# Patient Record
Sex: Female | Born: 1935
Health system: Southern US, Community
[De-identification: ages and names within clinical notes are randomized; demographics above are authoritative.]

## PROBLEM LIST (undated history)

## (undated) ENCOUNTER — Emergency Department (HOSPITAL_COMMUNITY): Disposition: A | Discharge status: Home / Self Care | Payer: Self-pay

## (undated) HISTORY — PX: CHOLECYSTECTOMY: SHX55

---

## 2014-04-16 DIAGNOSIS — M810 Age-related osteoporosis without current pathological fracture: Secondary | ICD-10-CM | POA: Diagnosis not present

## 2014-04-16 DIAGNOSIS — I10 Essential (primary) hypertension: Secondary | ICD-10-CM | POA: Diagnosis not present

## 2014-04-16 DIAGNOSIS — D329 Benign neoplasm of meninges, unspecified: Secondary | ICD-10-CM | POA: Diagnosis not present

## 2014-06-24 DIAGNOSIS — H524 Presbyopia: Secondary | ICD-10-CM | POA: Diagnosis not present

## 2014-06-24 DIAGNOSIS — H4011X1 Primary open-angle glaucoma, mild stage: Secondary | ICD-10-CM | POA: Diagnosis not present

## 2014-07-18 DIAGNOSIS — I10 Essential (primary) hypertension: Secondary | ICD-10-CM | POA: Diagnosis not present

## 2014-09-04 DIAGNOSIS — D329 Benign neoplasm of meninges, unspecified: Secondary | ICD-10-CM | POA: Diagnosis not present

## 2014-09-10 DIAGNOSIS — M779 Enthesopathy, unspecified: Secondary | ICD-10-CM | POA: Diagnosis not present

## 2014-09-10 DIAGNOSIS — L603 Nail dystrophy: Secondary | ICD-10-CM | POA: Diagnosis not present

## 2014-10-21 DIAGNOSIS — I10 Essential (primary) hypertension: Secondary | ICD-10-CM | POA: Diagnosis not present

## 2014-10-28 DIAGNOSIS — H4011X1 Primary open-angle glaucoma, mild stage: Secondary | ICD-10-CM | POA: Diagnosis not present

## 2015-01-30 DIAGNOSIS — H401131 Primary open-angle glaucoma, bilateral, mild stage: Secondary | ICD-10-CM | POA: Diagnosis not present

## 2015-02-06 DIAGNOSIS — I872 Venous insufficiency (chronic) (peripheral): Secondary | ICD-10-CM | POA: Diagnosis not present

## 2015-02-06 DIAGNOSIS — M810 Age-related osteoporosis without current pathological fracture: Secondary | ICD-10-CM | POA: Diagnosis not present

## 2015-02-06 DIAGNOSIS — H409 Unspecified glaucoma: Secondary | ICD-10-CM | POA: Diagnosis not present

## 2015-02-06 DIAGNOSIS — M549 Dorsalgia, unspecified: Secondary | ICD-10-CM | POA: Diagnosis not present

## 2015-02-06 DIAGNOSIS — Z1389 Encounter for screening for other disorder: Secondary | ICD-10-CM | POA: Diagnosis not present

## 2015-02-06 DIAGNOSIS — I10 Essential (primary) hypertension: Secondary | ICD-10-CM | POA: Diagnosis not present

## 2015-02-06 DIAGNOSIS — Z0000 Encounter for general adult medical examination without abnormal findings: Secondary | ICD-10-CM | POA: Diagnosis not present

## 2015-02-17 DIAGNOSIS — Z1231 Encounter for screening mammogram for malignant neoplasm of breast: Secondary | ICD-10-CM | POA: Diagnosis not present

## 2015-04-02 DIAGNOSIS — M810 Age-related osteoporosis without current pathological fracture: Secondary | ICD-10-CM | POA: Diagnosis not present

## 2015-04-02 DIAGNOSIS — M8580 Other specified disorders of bone density and structure, unspecified site: Secondary | ICD-10-CM | POA: Diagnosis not present

## 2015-04-16 DIAGNOSIS — M810 Age-related osteoporosis without current pathological fracture: Secondary | ICD-10-CM | POA: Diagnosis not present

## 2015-05-02 DIAGNOSIS — H401131 Primary open-angle glaucoma, bilateral, mild stage: Secondary | ICD-10-CM | POA: Diagnosis not present

## 2015-08-01 DIAGNOSIS — H401131 Primary open-angle glaucoma, bilateral, mild stage: Secondary | ICD-10-CM | POA: Diagnosis not present

## 2015-08-19 DIAGNOSIS — D329 Benign neoplasm of meninges, unspecified: Secondary | ICD-10-CM | POA: Diagnosis not present

## 2015-08-20 DIAGNOSIS — I639 Cerebral infarction, unspecified: Secondary | ICD-10-CM | POA: Diagnosis not present

## 2015-08-20 DIAGNOSIS — D320 Benign neoplasm of cerebral meninges: Secondary | ICD-10-CM | POA: Diagnosis not present

## 2015-08-20 DIAGNOSIS — D329 Benign neoplasm of meninges, unspecified: Secondary | ICD-10-CM | POA: Diagnosis not present

## 2015-09-08 DIAGNOSIS — I083 Combined rheumatic disorders of mitral, aortic and tricuspid valves: Secondary | ICD-10-CM | POA: Diagnosis not present

## 2015-09-08 DIAGNOSIS — R29818 Other symptoms and signs involving the nervous system: Secondary | ICD-10-CM | POA: Diagnosis not present

## 2015-09-08 DIAGNOSIS — I6611 Occlusion and stenosis of right anterior cerebral artery: Secondary | ICD-10-CM | POA: Diagnosis not present

## 2015-09-08 DIAGNOSIS — G459 Transient cerebral ischemic attack, unspecified: Secondary | ICD-10-CM | POA: Diagnosis not present

## 2015-09-08 DIAGNOSIS — I358 Other nonrheumatic aortic valve disorders: Secondary | ICD-10-CM | POA: Diagnosis not present

## 2015-09-08 DIAGNOSIS — I639 Cerebral infarction, unspecified: Secondary | ICD-10-CM | POA: Diagnosis not present

## 2015-09-09 DIAGNOSIS — I639 Cerebral infarction, unspecified: Secondary | ICD-10-CM | POA: Diagnosis not present

## 2015-09-10 DIAGNOSIS — D329 Benign neoplasm of meninges, unspecified: Secondary | ICD-10-CM | POA: Diagnosis not present

## 2015-09-10 DIAGNOSIS — D320 Benign neoplasm of cerebral meninges: Secondary | ICD-10-CM | POA: Diagnosis not present

## 2015-09-10 DIAGNOSIS — I63349 Cerebral infarction due to thrombosis of unspecified cerebellar artery: Secondary | ICD-10-CM | POA: Diagnosis not present

## 2015-09-12 DIAGNOSIS — I639 Cerebral infarction, unspecified: Secondary | ICD-10-CM | POA: Diagnosis not present

## 2015-09-30 DIAGNOSIS — M6281 Muscle weakness (generalized): Secondary | ICD-10-CM | POA: Diagnosis not present

## 2015-09-30 DIAGNOSIS — R5383 Other fatigue: Secondary | ICD-10-CM | POA: Diagnosis not present

## 2015-10-24 DIAGNOSIS — I358 Other nonrheumatic aortic valve disorders: Secondary | ICD-10-CM | POA: Diagnosis not present

## 2015-10-24 DIAGNOSIS — I63349 Cerebral infarction due to thrombosis of unspecified cerebellar artery: Secondary | ICD-10-CM | POA: Diagnosis not present

## 2015-10-30 DIAGNOSIS — M6281 Muscle weakness (generalized): Secondary | ICD-10-CM | POA: Diagnosis not present

## 2015-10-30 DIAGNOSIS — R5383 Other fatigue: Secondary | ICD-10-CM | POA: Diagnosis not present

## 2015-10-30 DIAGNOSIS — I10 Essential (primary) hypertension: Secondary | ICD-10-CM | POA: Diagnosis not present

## 2015-10-30 DIAGNOSIS — R413 Other amnesia: Secondary | ICD-10-CM | POA: Diagnosis not present

## 2015-11-03 DIAGNOSIS — H401131 Primary open-angle glaucoma, bilateral, mild stage: Secondary | ICD-10-CM | POA: Diagnosis not present

## 2016-02-02 DIAGNOSIS — M79602 Pain in left arm: Secondary | ICD-10-CM | POA: Diagnosis not present

## 2016-02-02 DIAGNOSIS — Z23 Encounter for immunization: Secondary | ICD-10-CM | POA: Diagnosis not present

## 2016-02-02 DIAGNOSIS — I10 Essential (primary) hypertension: Secondary | ICD-10-CM | POA: Diagnosis not present

## 2016-02-02 DIAGNOSIS — R413 Other amnesia: Secondary | ICD-10-CM | POA: Diagnosis not present

## 2016-02-03 DIAGNOSIS — H401131 Primary open-angle glaucoma, bilateral, mild stage: Secondary | ICD-10-CM | POA: Diagnosis not present

## 2016-02-27 DIAGNOSIS — Z1231 Encounter for screening mammogram for malignant neoplasm of breast: Secondary | ICD-10-CM | POA: Diagnosis not present

## 2016-03-08 DIAGNOSIS — I10 Essential (primary) hypertension: Secondary | ICD-10-CM | POA: Diagnosis not present

## 2016-03-08 DIAGNOSIS — R413 Other amnesia: Secondary | ICD-10-CM | POA: Diagnosis not present

## 2016-03-10 DIAGNOSIS — I639 Cerebral infarction, unspecified: Secondary | ICD-10-CM | POA: Diagnosis not present

## 2016-03-10 DIAGNOSIS — I10 Essential (primary) hypertension: Secondary | ICD-10-CM | POA: Diagnosis not present

## 2016-03-10 DIAGNOSIS — I999 Unspecified disorder of circulatory system: Secondary | ICD-10-CM | POA: Diagnosis not present

## 2016-03-10 DIAGNOSIS — D329 Benign neoplasm of meninges, unspecified: Secondary | ICD-10-CM | POA: Diagnosis not present

## 2016-04-07 DIAGNOSIS — R413 Other amnesia: Secondary | ICD-10-CM | POA: Diagnosis not present

## 2016-05-04 DIAGNOSIS — H401131 Primary open-angle glaucoma, bilateral, mild stage: Secondary | ICD-10-CM | POA: Diagnosis not present

## 2016-07-06 DIAGNOSIS — R413 Other amnesia: Secondary | ICD-10-CM | POA: Diagnosis not present

## 2016-07-06 DIAGNOSIS — I10 Essential (primary) hypertension: Secondary | ICD-10-CM | POA: Diagnosis not present

## 2016-08-03 DIAGNOSIS — H401131 Primary open-angle glaucoma, bilateral, mild stage: Secondary | ICD-10-CM | POA: Diagnosis not present

## 2016-09-21 DIAGNOSIS — R51 Headache: Secondary | ICD-10-CM | POA: Diagnosis not present

## 2016-09-27 DIAGNOSIS — D329 Benign neoplasm of meninges, unspecified: Secondary | ICD-10-CM | POA: Diagnosis not present

## 2016-09-27 DIAGNOSIS — D320 Benign neoplasm of cerebral meninges: Secondary | ICD-10-CM | POA: Diagnosis not present

## 2016-09-27 DIAGNOSIS — R51 Headache: Secondary | ICD-10-CM | POA: Diagnosis not present

## 2016-10-14 DIAGNOSIS — D329 Benign neoplasm of meninges, unspecified: Secondary | ICD-10-CM | POA: Diagnosis not present

## 2016-10-14 DIAGNOSIS — R51 Headache: Secondary | ICD-10-CM | POA: Diagnosis not present

## 2016-10-14 DIAGNOSIS — R413 Other amnesia: Secondary | ICD-10-CM | POA: Diagnosis not present

## 2016-10-21 DIAGNOSIS — R51 Headache: Secondary | ICD-10-CM | POA: Diagnosis not present

## 2016-10-21 DIAGNOSIS — R413 Other amnesia: Secondary | ICD-10-CM | POA: Diagnosis not present

## 2016-11-03 DIAGNOSIS — H401131 Primary open-angle glaucoma, bilateral, mild stage: Secondary | ICD-10-CM | POA: Diagnosis not present

## 2016-11-30 DIAGNOSIS — R51 Headache: Secondary | ICD-10-CM | POA: Diagnosis not present

## 2016-11-30 DIAGNOSIS — D329 Benign neoplasm of meninges, unspecified: Secondary | ICD-10-CM | POA: Diagnosis not present

## 2016-11-30 DIAGNOSIS — R413 Other amnesia: Secondary | ICD-10-CM | POA: Diagnosis not present

## 2016-12-30 DIAGNOSIS — R413 Other amnesia: Secondary | ICD-10-CM | POA: Diagnosis not present

## 2017-01-27 DIAGNOSIS — R413 Other amnesia: Secondary | ICD-10-CM | POA: Diagnosis not present

## 2017-01-27 DIAGNOSIS — Z23 Encounter for immunization: Secondary | ICD-10-CM | POA: Diagnosis not present

## 2017-01-27 DIAGNOSIS — I10 Essential (primary) hypertension: Secondary | ICD-10-CM | POA: Diagnosis not present

## 2017-03-28 DIAGNOSIS — R413 Other amnesia: Secondary | ICD-10-CM | POA: Diagnosis not present

## 2017-03-28 DIAGNOSIS — R51 Headache: Secondary | ICD-10-CM | POA: Diagnosis not present

## 2017-03-31 DIAGNOSIS — H524 Presbyopia: Secondary | ICD-10-CM | POA: Diagnosis not present

## 2017-03-31 DIAGNOSIS — H401131 Primary open-angle glaucoma, bilateral, mild stage: Secondary | ICD-10-CM | POA: Diagnosis not present

## 2017-04-07 DIAGNOSIS — Z0000 Encounter for general adult medical examination without abnormal findings: Secondary | ICD-10-CM | POA: Diagnosis not present

## 2017-04-07 DIAGNOSIS — R413 Other amnesia: Secondary | ICD-10-CM | POA: Diagnosis not present

## 2017-04-07 DIAGNOSIS — M810 Age-related osteoporosis without current pathological fracture: Secondary | ICD-10-CM | POA: Diagnosis not present

## 2017-04-07 DIAGNOSIS — I10 Essential (primary) hypertension: Secondary | ICD-10-CM | POA: Diagnosis not present

## 2017-05-05 DIAGNOSIS — M810 Age-related osteoporosis without current pathological fracture: Secondary | ICD-10-CM | POA: Diagnosis not present

## 2017-05-05 DIAGNOSIS — M85852 Other specified disorders of bone density and structure, left thigh: Secondary | ICD-10-CM | POA: Diagnosis not present

## 2017-05-12 DIAGNOSIS — R51 Headache: Secondary | ICD-10-CM | POA: Diagnosis not present

## 2017-05-12 DIAGNOSIS — R413 Other amnesia: Secondary | ICD-10-CM | POA: Diagnosis not present

## 2017-05-12 DIAGNOSIS — G301 Alzheimer's disease with late onset: Secondary | ICD-10-CM | POA: Diagnosis not present

## 2017-07-05 DIAGNOSIS — H401131 Primary open-angle glaucoma, bilateral, mild stage: Secondary | ICD-10-CM | POA: Diagnosis not present

## 2017-07-07 DIAGNOSIS — R413 Other amnesia: Secondary | ICD-10-CM | POA: Diagnosis not present

## 2017-07-07 DIAGNOSIS — I10 Essential (primary) hypertension: Secondary | ICD-10-CM | POA: Diagnosis not present

## 2017-07-07 DIAGNOSIS — H5340 Unspecified visual field defects: Secondary | ICD-10-CM | POA: Diagnosis not present

## 2017-07-14 DIAGNOSIS — R413 Other amnesia: Secondary | ICD-10-CM | POA: Diagnosis not present

## 2017-07-14 DIAGNOSIS — D320 Benign neoplasm of cerebral meninges: Secondary | ICD-10-CM | POA: Diagnosis not present

## 2017-07-14 DIAGNOSIS — H5340 Unspecified visual field defects: Secondary | ICD-10-CM | POA: Diagnosis not present

## 2017-08-08 DIAGNOSIS — M79675 Pain in left toe(s): Secondary | ICD-10-CM | POA: Diagnosis not present

## 2017-08-08 DIAGNOSIS — B029 Zoster without complications: Secondary | ICD-10-CM | POA: Diagnosis not present

## 2017-08-08 DIAGNOSIS — B079 Viral wart, unspecified: Secondary | ICD-10-CM | POA: Diagnosis not present

## 2017-08-15 DIAGNOSIS — B029 Zoster without complications: Secondary | ICD-10-CM | POA: Diagnosis not present

## 2017-08-18 DIAGNOSIS — S90122A Contusion of left lesser toe(s) without damage to nail, initial encounter: Secondary | ICD-10-CM | POA: Diagnosis not present

## 2017-08-18 DIAGNOSIS — M7752 Other enthesopathy of left foot: Secondary | ICD-10-CM | POA: Diagnosis not present

## 2017-10-06 DIAGNOSIS — H401131 Primary open-angle glaucoma, bilateral, mild stage: Secondary | ICD-10-CM | POA: Diagnosis not present

## 2017-10-06 DIAGNOSIS — H02055 Trichiasis without entropian left lower eyelid: Secondary | ICD-10-CM | POA: Diagnosis not present

## 2017-10-11 DIAGNOSIS — E785 Hyperlipidemia, unspecified: Secondary | ICD-10-CM | POA: Diagnosis not present

## 2017-10-11 DIAGNOSIS — I10 Essential (primary) hypertension: Secondary | ICD-10-CM | POA: Diagnosis not present

## 2017-10-27 DIAGNOSIS — H401131 Primary open-angle glaucoma, bilateral, mild stage: Secondary | ICD-10-CM | POA: Diagnosis not present

## 2017-11-01 DIAGNOSIS — M545 Low back pain: Secondary | ICD-10-CM | POA: Diagnosis not present

## 2017-12-01 DIAGNOSIS — G301 Alzheimer's disease with late onset: Secondary | ICD-10-CM | POA: Diagnosis not present

## 2017-12-01 DIAGNOSIS — D329 Benign neoplasm of meninges, unspecified: Secondary | ICD-10-CM | POA: Diagnosis not present

## 2017-12-01 DIAGNOSIS — R51 Headache: Secondary | ICD-10-CM | POA: Diagnosis not present

## 2018-01-19 DIAGNOSIS — I10 Essential (primary) hypertension: Secondary | ICD-10-CM | POA: Diagnosis not present

## 2018-01-19 DIAGNOSIS — Z23 Encounter for immunization: Secondary | ICD-10-CM | POA: Diagnosis not present

## 2018-01-19 DIAGNOSIS — Z1389 Encounter for screening for other disorder: Secondary | ICD-10-CM | POA: Diagnosis not present

## 2018-03-20 DIAGNOSIS — M47812 Spondylosis without myelopathy or radiculopathy, cervical region: Secondary | ICD-10-CM | POA: Diagnosis not present

## 2018-03-20 DIAGNOSIS — M50322 Other cervical disc degeneration at C5-C6 level: Secondary | ICD-10-CM | POA: Diagnosis not present

## 2018-03-20 DIAGNOSIS — M546 Pain in thoracic spine: Secondary | ICD-10-CM | POA: Diagnosis not present

## 2018-03-20 DIAGNOSIS — M549 Dorsalgia, unspecified: Secondary | ICD-10-CM | POA: Diagnosis not present

## 2018-03-20 DIAGNOSIS — M4802 Spinal stenosis, cervical region: Secondary | ICD-10-CM | POA: Diagnosis not present

## 2018-03-29 DIAGNOSIS — H401131 Primary open-angle glaucoma, bilateral, mild stage: Secondary | ICD-10-CM | POA: Diagnosis not present

## 2018-04-27 DIAGNOSIS — M810 Age-related osteoporosis without current pathological fracture: Secondary | ICD-10-CM | POA: Diagnosis not present

## 2018-04-27 DIAGNOSIS — R413 Other amnesia: Secondary | ICD-10-CM | POA: Diagnosis not present

## 2018-04-27 DIAGNOSIS — M549 Dorsalgia, unspecified: Secondary | ICD-10-CM | POA: Diagnosis not present

## 2018-04-27 DIAGNOSIS — Z1389 Encounter for screening for other disorder: Secondary | ICD-10-CM | POA: Diagnosis not present

## 2018-04-27 DIAGNOSIS — I10 Essential (primary) hypertension: Secondary | ICD-10-CM | POA: Diagnosis not present

## 2018-04-27 DIAGNOSIS — Z0000 Encounter for general adult medical examination without abnormal findings: Secondary | ICD-10-CM | POA: Diagnosis not present

## 2018-05-31 DIAGNOSIS — G301 Alzheimer's disease with late onset: Secondary | ICD-10-CM | POA: Diagnosis not present

## 2018-11-03 DIAGNOSIS — H1032 Unspecified acute conjunctivitis, left eye: Secondary | ICD-10-CM | POA: Diagnosis not present

## 2018-11-29 DIAGNOSIS — R51 Headache: Secondary | ICD-10-CM | POA: Diagnosis not present

## 2018-11-29 DIAGNOSIS — G301 Alzheimer's disease with late onset: Secondary | ICD-10-CM | POA: Diagnosis not present

## 2019-01-12 DIAGNOSIS — I10 Essential (primary) hypertension: Secondary | ICD-10-CM | POA: Diagnosis not present

## 2019-01-12 DIAGNOSIS — Z23 Encounter for immunization: Secondary | ICD-10-CM | POA: Diagnosis not present

## 2019-04-24 DIAGNOSIS — H40033 Anatomical narrow angle, bilateral: Secondary | ICD-10-CM | POA: Diagnosis not present

## 2019-04-24 DIAGNOSIS — H5340 Unspecified visual field defects: Secondary | ICD-10-CM | POA: Diagnosis not present

## 2019-04-24 DIAGNOSIS — H40053 Ocular hypertension, bilateral: Secondary | ICD-10-CM | POA: Diagnosis not present

## 2019-04-24 DIAGNOSIS — H33312 Horseshoe tear of retina without detachment, left eye: Secondary | ICD-10-CM | POA: Diagnosis not present

## 2019-06-04 DIAGNOSIS — D329 Benign neoplasm of meninges, unspecified: Secondary | ICD-10-CM | POA: Diagnosis not present

## 2019-06-04 DIAGNOSIS — G301 Alzheimer's disease with late onset: Secondary | ICD-10-CM | POA: Diagnosis not present

## 2019-06-04 DIAGNOSIS — R413 Other amnesia: Secondary | ICD-10-CM | POA: Diagnosis not present

## 2019-06-28 DIAGNOSIS — H33312 Horseshoe tear of retina without detachment, left eye: Secondary | ICD-10-CM | POA: Diagnosis not present

## 2019-07-20 DIAGNOSIS — Z1389 Encounter for screening for other disorder: Secondary | ICD-10-CM | POA: Diagnosis not present

## 2019-07-20 DIAGNOSIS — I10 Essential (primary) hypertension: Secondary | ICD-10-CM | POA: Diagnosis not present

## 2019-07-20 DIAGNOSIS — Z0000 Encounter for general adult medical examination without abnormal findings: Secondary | ICD-10-CM | POA: Diagnosis not present

## 2019-07-20 DIAGNOSIS — M810 Age-related osteoporosis without current pathological fracture: Secondary | ICD-10-CM | POA: Diagnosis not present

## 2019-07-20 DIAGNOSIS — M546 Pain in thoracic spine: Secondary | ICD-10-CM | POA: Diagnosis not present

## 2019-08-07 DIAGNOSIS — R519 Headache, unspecified: Secondary | ICD-10-CM | POA: Diagnosis not present

## 2019-10-09 DIAGNOSIS — G301 Alzheimer's disease with late onset: Secondary | ICD-10-CM | POA: Diagnosis not present

## 2019-10-12 ENCOUNTER — Encounter (HOSPITAL_COMMUNITY): Payer: Self-pay

## 2019-10-12 ENCOUNTER — Other Ambulatory Visit: Payer: Self-pay

## 2019-10-12 ENCOUNTER — Emergency Department (HOSPITAL_COMMUNITY): Payer: Medicare Other

## 2019-10-12 ENCOUNTER — Emergency Department (HOSPITAL_COMMUNITY): Payer: Medicare Other | Admitting: Certified Registered Nurse Anesthetist

## 2019-10-12 ENCOUNTER — Inpatient Hospital Stay (HOSPITAL_COMMUNITY): Payer: Medicare Other

## 2019-10-12 ENCOUNTER — Encounter (HOSPITAL_COMMUNITY)
Admission: EM | Disposition: A | Discharge status: Home Health | Payer: Self-pay | Source: Home / Self Care | Attending: Neurology

## 2019-10-12 ENCOUNTER — Inpatient Hospital Stay (HOSPITAL_COMMUNITY)
Admission: EM | Admit: 2019-10-12 | Discharge: 2019-10-16 | DRG: 041 | Disposition: A | Discharge status: Home Health | Payer: Medicare Other | Attending: Neurology | Admitting: Neurology

## 2019-10-12 DIAGNOSIS — R29818 Other symptoms and signs involving the nervous system: Secondary | ICD-10-CM | POA: Diagnosis not present

## 2019-10-12 DIAGNOSIS — R4702 Dysphasia: Secondary | ICD-10-CM | POA: Diagnosis not present

## 2019-10-12 DIAGNOSIS — F0280 Dementia in other diseases classified elsewhere without behavioral disturbance: Secondary | ICD-10-CM | POA: Diagnosis present

## 2019-10-12 DIAGNOSIS — R4781 Slurred speech: Secondary | ICD-10-CM | POA: Diagnosis not present

## 2019-10-12 DIAGNOSIS — I639 Cerebral infarction, unspecified: Secondary | ICD-10-CM | POA: Diagnosis not present

## 2019-10-12 DIAGNOSIS — R471 Dysarthria and anarthria: Secondary | ICD-10-CM | POA: Diagnosis present

## 2019-10-12 DIAGNOSIS — I129 Hypertensive chronic kidney disease with stage 1 through stage 4 chronic kidney disease, or unspecified chronic kidney disease: Secondary | ICD-10-CM | POA: Diagnosis present

## 2019-10-12 DIAGNOSIS — D329 Benign neoplasm of meninges, unspecified: Secondary | ICD-10-CM | POA: Diagnosis present

## 2019-10-12 DIAGNOSIS — R404 Transient alteration of awareness: Secondary | ICD-10-CM | POA: Diagnosis not present

## 2019-10-12 DIAGNOSIS — I083 Combined rheumatic disorders of mitral, aortic and tricuspid valves: Secondary | ICD-10-CM | POA: Diagnosis present

## 2019-10-12 DIAGNOSIS — N1832 Chronic kidney disease, stage 3b: Secondary | ICD-10-CM | POA: Diagnosis present

## 2019-10-12 DIAGNOSIS — Z03818 Encounter for observation for suspected exposure to other biological agents ruled out: Secondary | ICD-10-CM | POA: Diagnosis not present

## 2019-10-12 DIAGNOSIS — R29810 Facial weakness: Secondary | ICD-10-CM | POA: Diagnosis present

## 2019-10-12 DIAGNOSIS — R29704 NIHSS score 4: Secondary | ICD-10-CM | POA: Diagnosis present

## 2019-10-12 DIAGNOSIS — I472 Ventricular tachycardia: Secondary | ICD-10-CM | POA: Diagnosis not present

## 2019-10-12 DIAGNOSIS — G936 Cerebral edema: Secondary | ICD-10-CM | POA: Diagnosis not present

## 2019-10-12 DIAGNOSIS — H9190 Unspecified hearing loss, unspecified ear: Secondary | ICD-10-CM | POA: Diagnosis not present

## 2019-10-12 DIAGNOSIS — E785 Hyperlipidemia, unspecified: Secondary | ICD-10-CM | POA: Diagnosis not present

## 2019-10-12 DIAGNOSIS — I6602 Occlusion and stenosis of left middle cerebral artery: Secondary | ICD-10-CM | POA: Diagnosis present

## 2019-10-12 DIAGNOSIS — R6889 Other general symptoms and signs: Secondary | ICD-10-CM | POA: Diagnosis not present

## 2019-10-12 DIAGNOSIS — H409 Unspecified glaucoma: Secondary | ICD-10-CM | POA: Diagnosis not present

## 2019-10-12 DIAGNOSIS — I6340 Cerebral infarction due to embolism of unspecified cerebral artery: Secondary | ICD-10-CM | POA: Diagnosis present

## 2019-10-12 DIAGNOSIS — Z743 Need for continuous supervision: Secondary | ICD-10-CM | POA: Diagnosis not present

## 2019-10-12 DIAGNOSIS — I361 Nonrheumatic tricuspid (valve) insufficiency: Secondary | ICD-10-CM | POA: Diagnosis not present

## 2019-10-12 DIAGNOSIS — R4701 Aphasia: Secondary | ICD-10-CM | POA: Diagnosis present

## 2019-10-12 DIAGNOSIS — I10 Essential (primary) hypertension: Secondary | ICD-10-CM | POA: Diagnosis present

## 2019-10-12 DIAGNOSIS — I63412 Cerebral infarction due to embolism of left middle cerebral artery: Secondary | ICD-10-CM | POA: Diagnosis not present

## 2019-10-12 DIAGNOSIS — Z79899 Other long term (current) drug therapy: Secondary | ICD-10-CM | POA: Diagnosis not present

## 2019-10-12 DIAGNOSIS — Z20822 Contact with and (suspected) exposure to covid-19: Secondary | ICD-10-CM | POA: Diagnosis present

## 2019-10-12 DIAGNOSIS — Z7982 Long term (current) use of aspirin: Secondary | ICD-10-CM

## 2019-10-12 DIAGNOSIS — G319 Degenerative disease of nervous system, unspecified: Secondary | ICD-10-CM | POA: Diagnosis not present

## 2019-10-12 DIAGNOSIS — R9082 White matter disease, unspecified: Secondary | ICD-10-CM | POA: Diagnosis not present

## 2019-10-12 DIAGNOSIS — I340 Nonrheumatic mitral (valve) insufficiency: Secondary | ICD-10-CM | POA: Diagnosis not present

## 2019-10-12 DIAGNOSIS — G9389 Other specified disorders of brain: Secondary | ICD-10-CM | POA: Diagnosis not present

## 2019-10-12 DIAGNOSIS — N183 Chronic kidney disease, stage 3 unspecified: Secondary | ICD-10-CM | POA: Diagnosis present

## 2019-10-12 DIAGNOSIS — G309 Alzheimer's disease, unspecified: Secondary | ICD-10-CM | POA: Diagnosis present

## 2019-10-12 DIAGNOSIS — I63312 Cerebral infarction due to thrombosis of left middle cerebral artery: Secondary | ICD-10-CM | POA: Diagnosis not present

## 2019-10-12 DIAGNOSIS — D320 Benign neoplasm of cerebral meninges: Secondary | ICD-10-CM | POA: Diagnosis not present

## 2019-10-12 DIAGNOSIS — I6389 Other cerebral infarction: Secondary | ICD-10-CM | POA: Diagnosis not present

## 2019-10-12 DIAGNOSIS — M6281 Muscle weakness (generalized): Secondary | ICD-10-CM | POA: Diagnosis not present

## 2019-10-12 HISTORY — PX: RADIOLOGY WITH ANESTHESIA: SHX6223

## 2019-10-12 HISTORY — PX: IR ANGIO INTRA EXTRACRAN SEL COM CAROTID INNOMINATE UNI L MOD SED: IMG5358

## 2019-10-12 LAB — CBC
HCT: 39.5 % (ref 36.0–46.0)
Hemoglobin: 12.2 g/dL (ref 12.0–15.0)
MCH: 28.6 pg (ref 26.0–34.0)
MCHC: 30.9 g/dL (ref 30.0–36.0)
MCV: 92.7 fL (ref 80.0–100.0)
Platelets: 197 10*3/uL (ref 150–400)
RBC: 4.26 MIL/uL (ref 3.87–5.11)
RDW: 14.7 % (ref 11.5–15.5)
WBC: 7.5 10*3/uL (ref 4.0–10.5)
nRBC: 0 % (ref 0.0–0.2)

## 2019-10-12 LAB — COMPREHENSIVE METABOLIC PANEL
ALT: 13 U/L (ref 0–44)
AST: 24 U/L (ref 15–41)
Albumin: 3.7 g/dL (ref 3.5–5.0)
Alkaline Phosphatase: 67 U/L (ref 38–126)
Anion gap: 8 (ref 5–15)
BUN: 23 mg/dL (ref 8–23)
CO2: 26 mmol/L (ref 22–32)
Calcium: 9 mg/dL (ref 8.9–10.3)
Chloride: 105 mmol/L (ref 98–111)
Creatinine, Ser: 1.46 mg/dL — ABNORMAL HIGH (ref 0.44–1.00)
GFR calc Af Amer: 38 mL/min — ABNORMAL LOW (ref >60)
GFR calc non Af Amer: 33 mL/min — ABNORMAL LOW (ref >60)
Glucose, Bld: 92 mg/dL (ref 70–99)
Potassium: 4.6 mmol/L (ref 3.5–5.1)
Sodium: 139 mmol/L (ref 135–145)
Total Bilirubin: 0.7 mg/dL (ref 0.3–1.2)
Total Protein: 6.7 g/dL (ref 6.5–8.1)

## 2019-10-12 LAB — DIFFERENTIAL
Abs Immature Granulocytes: 0.03 10*3/uL (ref 0.00–0.07)
Basophils Absolute: 0.1 10*3/uL (ref 0.0–0.1)
Basophils Relative: 1 %
Eosinophils Absolute: 0.2 10*3/uL (ref 0.0–0.5)
Eosinophils Relative: 3 %
Immature Granulocytes: 0 %
Lymphocytes Relative: 27 %
Lymphs Abs: 2 10*3/uL (ref 0.7–4.0)
Monocytes Absolute: 0.7 10*3/uL (ref 0.1–1.0)
Monocytes Relative: 9 %
Neutro Abs: 4.5 10*3/uL (ref 1.7–7.7)
Neutrophils Relative %: 60 %

## 2019-10-12 LAB — I-STAT CHEM 8, ED
BUN: 28 mg/dL — ABNORMAL HIGH (ref 8–23)
Calcium, Ion: 1.13 mmol/L — ABNORMAL LOW (ref 1.15–1.40)
Chloride: 104 mmol/L (ref 98–111)
Creatinine, Ser: 1.6 mg/dL — ABNORMAL HIGH (ref 0.44–1.00)
Glucose, Bld: 88 mg/dL (ref 70–99)
HCT: 37 % (ref 36.0–46.0)
Hemoglobin: 12.6 g/dL (ref 12.0–15.0)
Potassium: 4.8 mmol/L (ref 3.5–5.1)
Sodium: 141 mmol/L (ref 135–145)
TCO2: 28 mmol/L (ref 22–32)

## 2019-10-12 LAB — PROTIME-INR
INR: 1 (ref 0.8–1.2)
Prothrombin Time: 12.7 seconds (ref 11.4–15.2)

## 2019-10-12 LAB — APTT: aPTT: 24 seconds (ref 24–36)

## 2019-10-12 LAB — MRSA PCR SCREENING: MRSA by PCR: NEGATIVE

## 2019-10-12 LAB — SARS CORONAVIRUS 2 BY RT PCR (HOSPITAL ORDER, PERFORMED IN ~~LOC~~ HOSPITAL LAB): SARS Coronavirus 2: NEGATIVE

## 2019-10-12 LAB — CBG MONITORING, ED: Glucose-Capillary: 89 mg/dL (ref 70–99)

## 2019-10-12 SURGERY — IR WITH ANESTHESIA
Anesthesia: General

## 2019-10-12 MED ORDER — TIROFIBAN HCL IN NACL 5-0.9 MG/100ML-% IV SOLN
INTRAVENOUS | Status: AC
  Filled 2019-10-12: qty 100

## 2019-10-12 MED ORDER — CLEVIDIPINE BUTYRATE 0.5 MG/ML IV EMUL
INTRAVENOUS | Status: AC
  Filled 2019-10-12: qty 50

## 2019-10-12 MED ORDER — SUCCINYLCHOLINE 20MG/ML (10ML) SYRINGE FOR MEDFUSION PUMP - OPTIME
INTRAMUSCULAR | Status: DC | PRN
Start: 2019-10-12 — End: 2019-10-12
  Administered 2019-10-12: 100 mg via INTRAVENOUS

## 2019-10-12 MED ORDER — FENTANYL CITRATE (PF) 100 MCG/2ML IJ SOLN
INTRAMUSCULAR | Status: AC
  Filled 2019-10-12: qty 2

## 2019-10-12 MED ORDER — SENNOSIDES-DOCUSATE SODIUM 8.6-50 MG PO TABS: 1.0000 | ORAL_TABLET | Freq: Every evening | ORAL | Status: DC | PRN

## 2019-10-12 MED ORDER — LIDOCAINE HCL (CARDIAC) PF 100 MG/5ML IV SOSY
PREFILLED_SYRINGE | INTRAVENOUS | Status: DC | PRN
  Administered 2019-10-12: 40 mg via INTRATRACHEAL

## 2019-10-12 MED ORDER — NITROGLYCERIN 1 MG/10 ML FOR IR/CATH LAB
INTRA_ARTERIAL | Status: AC
  Filled 2019-10-12: qty 10

## 2019-10-12 MED ORDER — SUGAMMADEX SODIUM 200 MG/2ML IV SOLN
INTRAVENOUS | Status: DC | PRN
Start: 2019-10-12 — End: 2019-10-12
  Administered 2019-10-12: 200 mg via INTRAVENOUS

## 2019-10-12 MED ORDER — LACTATED RINGERS IV SOLN: INTRAVENOUS | Status: DC | PRN

## 2019-10-12 MED ORDER — DONEPEZIL HCL 10 MG PO TABS
20.0000 mg | ORAL_TABLET | Freq: Every day | ORAL | Status: DC
  Administered 2019-10-13 – 2019-10-15 (×3): 20 mg via ORAL
  Filled 2019-10-12 (×3): qty 2

## 2019-10-12 MED ORDER — CITALOPRAM HYDROBROMIDE 10 MG PO TABS
20.0000 mg | ORAL_TABLET | Freq: Every day | ORAL | Status: DC
  Administered 2019-10-13 – 2019-10-16 (×4): 20 mg via ORAL
  Filled 2019-10-12 (×4): qty 2

## 2019-10-12 MED ORDER — TICAGRELOR 90 MG PO TABS
ORAL_TABLET | ORAL | Status: AC
  Filled 2019-10-12: qty 2

## 2019-10-12 MED ORDER — SODIUM CHLORIDE 0.9% FLUSH
3.0000 mL | Freq: Once | INTRAVENOUS | Status: AC
Start: 2019-10-12 — End: 2019-10-12
  Administered 2019-10-12: 3 mL via INTRAVENOUS

## 2019-10-12 MED ORDER — DEXAMETHASONE SODIUM PHOSPHATE 10 MG/ML IJ SOLN
INTRAMUSCULAR | Status: DC | PRN
  Administered 2019-10-12: 5 mg via INTRAVENOUS

## 2019-10-12 MED ORDER — SODIUM CHLORIDE 0.9 % IV SOLN: INTRAVENOUS | Status: AC

## 2019-10-12 MED ORDER — VERAPAMIL HCL 2.5 MG/ML IV SOLN
INTRAVENOUS | Status: AC
  Filled 2019-10-12: qty 2

## 2019-10-12 MED ORDER — ASPIRIN 300 MG RE SUPP
600.0000 mg | Freq: Once | RECTAL | Status: AC
  Administered 2019-10-12: 600 mg via RECTAL

## 2019-10-12 MED ORDER — SODIUM CHLORIDE 0.9 % IV SOLN: INTRAVENOUS | Status: DC

## 2019-10-12 MED ORDER — CEFAZOLIN SODIUM-DEXTROSE 2-3 GM-%(50ML) IV SOLR
INTRAVENOUS | Status: DC | PRN
  Administered 2019-10-12: 2 g via INTRAVENOUS

## 2019-10-12 MED ORDER — CHLORHEXIDINE GLUCONATE CLOTH 2 % EX PADS
6.0000 | MEDICATED_PAD | Freq: Every day | CUTANEOUS | Status: DC
  Administered 2019-10-12 – 2019-10-14 (×3): 6 via TOPICAL

## 2019-10-12 MED ORDER — ACETAMINOPHEN 160 MG/5ML PO SOLN: 650.0000 mg | ORAL | Status: DC | PRN

## 2019-10-12 MED ORDER — CLOPIDOGREL BISULFATE 300 MG PO TABS
ORAL_TABLET | ORAL | Status: AC
  Filled 2019-10-12: qty 1

## 2019-10-12 MED ORDER — FENTANYL CITRATE (PF) 250 MCG/5ML IJ SOLN
INTRAMUSCULAR | Status: DC | PRN
  Administered 2019-10-12: 50 ug via INTRAVENOUS

## 2019-10-12 MED ORDER — IOHEXOL 240 MG/ML SOLN
INTRAMUSCULAR | Status: AC
  Filled 2019-10-12: qty 200

## 2019-10-12 MED ORDER — ONDANSETRON HCL 4 MG/2ML IJ SOLN
INTRAMUSCULAR | Status: DC | PRN
Start: 2019-10-12 — End: 2019-10-12
  Administered 2019-10-12: 4 mg via INTRAVENOUS

## 2019-10-12 MED ORDER — EPTIFIBATIDE 20 MG/10ML IV SOLN
INTRAVENOUS | Status: AC
  Filled 2019-10-12: qty 10

## 2019-10-12 MED ORDER — ACETAMINOPHEN 650 MG RE SUPP: 650.0000 mg | RECTAL | Status: DC | PRN

## 2019-10-12 MED ORDER — CLEVIDIPINE BUTYRATE 0.5 MG/ML IV EMUL
INTRAVENOUS | Status: AC
  Administered 2019-10-12: 8 mg/h via INTRAVENOUS
  Filled 2019-10-12: qty 50

## 2019-10-12 MED ORDER — CLEVIDIPINE BUTYRATE 0.5 MG/ML IV EMUL
INTRAVENOUS | Status: DC | PRN
Start: 2019-10-12 — End: 2019-10-12
  Administered 2019-10-12: 4 mg/h via INTRAVENOUS

## 2019-10-12 MED ORDER — SODIUM CHLORIDE 0.9 % IV SOLN
INTRAVENOUS | Status: DC | PRN
Start: 2019-10-12 — End: 2019-10-12

## 2019-10-12 MED ORDER — PROPOFOL 10 MG/ML IV BOLUS
INTRAVENOUS | Status: DC | PRN
Start: 2019-10-12 — End: 2019-10-12
  Administered 2019-10-12: 80 mg via INTRAVENOUS

## 2019-10-12 MED ORDER — IOHEXOL 350 MG/ML SOLN
100.0000 mL | Freq: Once | INTRAVENOUS | Status: AC | PRN
  Administered 2019-10-12: 100 mL via INTRAVENOUS

## 2019-10-12 MED ORDER — IOHEXOL 300 MG/ML  SOLN
150.0000 mL | Freq: Once | INTRAMUSCULAR | Status: AC | PRN
  Administered 2019-10-12: 25 mL via INTRA_ARTERIAL

## 2019-10-12 MED ORDER — STROKE: EARLY STAGES OF RECOVERY BOOK
Freq: Once | Status: AC
  Administered 2019-10-12: 1
  Filled 2019-10-12: qty 1

## 2019-10-12 MED ORDER — ACETAMINOPHEN 325 MG PO TABS: 650.0000 mg | ORAL_TABLET | ORAL | Status: DC | PRN

## 2019-10-12 MED ORDER — ASPIRIN 81 MG PO CHEW
CHEWABLE_TABLET | ORAL | Status: AC
  Filled 2019-10-12: qty 1

## 2019-10-12 MED ORDER — CEFAZOLIN SODIUM-DEXTROSE 2-4 GM/100ML-% IV SOLN
INTRAVENOUS | Status: AC
  Filled 2019-10-12: qty 100

## 2019-10-12 MED ORDER — NETARSUDIL-LATANOPROST 0.02-0.005 % OP SOLN
1.0000 [drp] | Freq: Every evening | OPHTHALMIC | Status: DC
  Administered 2019-10-13 – 2019-10-15 (×3): 1 [drp] via OPHTHALMIC

## 2019-10-12 MED ORDER — CLEVIDIPINE BUTYRATE 0.5 MG/ML IV EMUL: 0.0000 mg/h | INTRAVENOUS | Status: AC

## 2019-10-12 MED ORDER — ORAL CARE MOUTH RINSE
15.0000 mL | Freq: Two times a day (BID) | OROMUCOSAL | Status: DC
  Administered 2019-10-12 – 2019-10-16 (×7): 15 mL via OROMUCOSAL

## 2019-10-12 MED ORDER — LISINOPRIL 10 MG PO TABS
10.0000 mg | ORAL_TABLET | Freq: Every day | ORAL | Status: DC
  Administered 2019-10-13 – 2019-10-16 (×4): 10 mg via ORAL
  Filled 2019-10-12 (×4): qty 1

## 2019-10-12 NOTE — Consult Note (Addendum)
Neurology Admission History and Physical  Referring Physician: Dr. Regenia Skeeter    Chief Complaint: Acute onset of aphasia  HPI: Summer [name] is an 85 y.o. female with a prior history of mild dementia who presents to the ED as a Code Stroke for assessment of acute onset aphasia. LKN was 41 yesterday, when her son spoke to her on the telephone. Daughter in law visited at [place] today for the "morning check up" and noticed that patient would not speak to her - daughter in law thought that she was just waking up. However, when the phone rang and patient tried to answer it, her speech was garbled. Daughter in law called 65. On EMS arrival she followed all commands, was able to walk, but could not speak, except for "Ok" and otherwise garbled speech - left side of mouth would move when trying to speak, but right side of mouth would not move. Code Stroke was called en route.   On arrival to the ED, she continued to be aphasic.   LSN: 1400 yesterday tPA Given: No: Out of tPA time window NIHSS: 4 mRS: 1  PMHx: Meningioma Mild dementia (Alzheimer disease) HTN Glaucoma  Meds: ASA 81 mg qd Celexa Donepezil Lisinopril Namenda Vitamin B12 Vitamin D  No family history on file.   Social History:  has no history on file for tobacco use, alcohol use, and drug use.  Allergies: Not on File  ROS: Unable to obtain due to aphasia.   Physical Examination: There were no vitals taken for this visit.  HEENT: [place]/AT Lungs: Respirations unlabored Ext: No edema  Neurologic Examination: Mental Status: Awake and alert. Good eye contact. Follows some simple commands. Dense expressive aphasia on arrival to the ED. Following CT, the patient could name several objects with severely dysarthric words, but all verbal output consisted of single word answers, consistent with severely impaired fluency. She was able to repeat all words in a phrase in correct order - again with severe dysarthria. Comprehension  continued to be intact to simple commands, but she had difficulty with more difficulty commands such as for cerebellar testing or for motor testing of specific muscle groups - often required examiner to pantomime motor commands for her to comprehend them. She was partially oriented to time and place.  Cranial Nerves:  II:  Visual fields intact with no extinction to DSS. PERRL. III,IV, VI: No ptosis. EOMI. No nystagmus.  V,VII: Mild right facial droop. Facial temp sensation equal bilaterally VIII: hearing intact to repeated commands IX,X: Palate not visualized XI: Symmetric XII: midline tongue extension Motor: RUE 5/5 RLE 5/5 LUE 5/5 LLE 5/5 No pronator drift.  No leg drift.  Sensory: Temp and light touch intact in upper and lower extremities bilaterally.  Deep Tendon Reflexes:  2+ bilateral biceps and brachioradialis 3+ left patellar 4+ right patellar Plantars: Right: downgoing   Left: downgoing Cerebellar: No ataxia with FNF bilaterally  Gait: Deferred in the context of acuity of presentation   Results for orders placed or performed during the hospital encounter of 10/12/19 (from the past 48 hour(s))  CBG monitoring, ED     Status: None   Collection Time: 10/12/19 11:07 AM  Result Value Ref Range   Glucose-Capillary 89 70 - 99 mg/dL    Comment: Glucose reference range applies only to samples taken after fasting for at least 8 hours.   Imaging studies in the assessment below were personally reviewed.   Assessment: 85 y.o. female presenting with acute onset of aphasia and  right facial droop 1. Exam reveals dense receptive and expressive dysphasia. Right facial droop also noted.  2. CT head: No acute infarct or hemorrhage. ASPECTS is 10. Stable calcified meningiomas in right para clinoid region and left tentorium. 3. CTA of head and neck: Positive for left M3 branch occlusion of the superior division left MCA. No significant carotid or vertebral artery stenosis in the neck.  3.  CTP negative for acute infarct or ischemia using the standard parameters. However on T-max greater than 4 seconds there is 11 mm of delayed perfusion in the area of the speech center 4. Stroke Risk Factors - HTN 5. The patient is a VIR candidate. Risks/benefits of the procedure were discussed extensively with patient's son, including approximately 50% chance of significant improvement relative to an approximate 10% chance of subarachnoid hemorrhage with possibility of significant worsening including death. The patient's son expressed understanding and provided informed consent to proceed with VIR. All questions answered. The patient's aphasia precluded meaningful medical decision making on her part.  6. Mild Alzheimer dementia. 7. Calcified meningiomas as noted in the imaging reports.   Plan: 1. Admitting to Neuro ICU under the Neurology service following VIR 2. Post-VIR order set to include frequent neuro checks and BP management.  3. No antiplatelet medications or anticoagulants for at least 24 hours following VIR, unless otherwise ordered by Dr. Estanislado Pandy.  4. DVT prophylaxis with SCDs.  5. Will need escalation of antiplatelet therapy if follow up CT at 24 hours is negative for hemorrhagic conversion. 7. TTE.  8. MRI brain  9. PT/OT/Speech.  10. NPO until passes swallow evaluation.  11. Telemetry monitoring 12. Fasting lipid panel, HgbA1c 13. Continue Aricept and Namenda  65 minutes spent in the emergent neurological evaluation and management of this critically ill patient  Electronically signed: Dr. Kerney Elbe  10/12/2019, 11:13 AM

## 2019-10-12 NOTE — Anesthesia Postprocedure Evaluation (Signed)
Anesthesia Post Note  Patient: Summer [name]  Procedure(s) Performed: IR WITH ANESTHESIA (N/A )     Patient location during evaluation: PACU Anesthesia Type: General Level of consciousness: awake Pain management: pain level controlled Vital Signs Assessment: post-procedure vital signs reviewed and stable Respiratory status: spontaneous breathing, nonlabored ventilation, respiratory function stable and patient connected to nasal cannula oxygen Cardiovascular status: blood pressure returned to baseline and stable Postop Assessment: no apparent nausea or vomiting Anesthetic complications: no   No complications documented.  Last Vitals:  Vitals:   10/12/19 1530 10/12/19 1535  BP:    Pulse: 69 77  Resp: 13 11  Temp:    SpO2: 100% 100%    Last Pain:  Vitals:   10/12/19 1500  TempSrc: Axillary  PainSc:                  Effie Berkshire

## 2019-10-12 NOTE — ED Provider Notes (Signed)
[facility] EMERGENCY DEPARTMENT Provider Note   CSN: [id number] Arrival date & time: 10/12/19  1105  LEVEL 5 CAVEAT - APHASIA History Chief Complaint  Patient presents with   Code Stroke    Summer [name] is a 85 y.o. female.  HPI 85 year old female brought in by EMS as a code stroke. EMS history indicates last known normal 2 PM by family yesterday. She lives at home but family checks on her twice a day. Called her this morning and could not get a good response and she is noted to be quite aphasic with a facial droop. No extremity weakness.   History reviewed. No pertinent past medical history.  There are no problems to display for this patient.   History reviewed. No pertinent surgical history.   OB History   No obstetric history on file.     No family history on file.  Social History   Tobacco Use   Smoking status: Not on file  Substance Use Topics   Alcohol use: Not on file   Drug use: Not on file    Home Medications Prior to Admission medications   Not on File    Allergies    Patient has no allergy information on record.  Review of Systems   Review of Systems  Unable to perform ROS: Mental status change    Physical Exam Updated Vital Signs BP (!) 142/82    Pulse 71    Temp 97.8 F (36.6 C) (Oral)    Resp 18    Ht 5\' 8"  (1.727 m)    Wt 64.1 kg    SpO2 98%    BMI 21.49 kg/m   Physical Exam Vitals and nursing note reviewed.  Constitutional:      Appearance: She is well-developed.  HENT:     Head: Normocephalic and atraumatic.     Right Ear: External ear normal.     Left Ear: External ear normal.     Nose: Nose normal.  Eyes:     General:        Right eye: No discharge.        Left eye: No discharge.     Extraocular Movements: Extraocular movements intact.     Pupils: Pupils are equal, round, and reactive to light.  Cardiovascular:     Rate and Rhythm: Normal rate and regular rhythm.     Heart sounds: Normal heart  sounds.  Pulmonary:     Effort: Pulmonary effort is normal.     Breath sounds: Normal breath sounds.  Abdominal:     Palpations: Abdomen is soft.     Tenderness: There is no abdominal tenderness.  Skin:    General: Skin is warm and dry.  Neurological:     Mental Status: She is alert.     Comments: Awake, alert, but hard to understand with expressive aphasia. Right facial droop. 5/5 strength in all 4 extremities.   Psychiatric:        Mood and Affect: Mood is not anxious.     ED Results / Procedures / Treatments   Labs (all labs ordered are listed, but only abnormal results are displayed) Labs Reviewed  COMPREHENSIVE METABOLIC PANEL - Abnormal; Notable for the following components:      Result Value   Creatinine, Ser 1.46 (*)    GFR calc non Af Amer 33 (*)    GFR calc Af Amer 38 (*)    All other components within normal limits  I-STAT CHEM  8, ED - Abnormal; Notable for the following components:   BUN 28 (*)    Creatinine, Ser 1.60 (*)    Calcium, Ion 1.13 (*)    All other components within normal limits  SARS CORONAVIRUS 2 BY RT PCR (HOSPITAL ORDER, [place] [facility])  PROTIME-INR  APTT  CBC  DIFFERENTIAL  CBG MONITORING, ED  CBG MONITORING, ED    EKG None  Radiology CT Code Stroke CTA Head W/WO contrast  Result Date: 10/12/2019 CLINICAL DATA:  Acute neuro deficit. Aphasia. Last seen normal 1600 hours yesterday. EXAM: CT ANGIOGRAPHY HEAD AND NECK CT PERFUSION BRAIN TECHNIQUE: Multidetector CT imaging of the head and neck was performed using the standard protocol during bolus administration of intravenous contrast. Multiplanar CT image reconstructions and MIPs were obtained to evaluate the vascular anatomy. Carotid stenosis measurements (when applicable) are obtained utilizing NASCET criteria, using the distal internal carotid diameter as the denominator. Multiphase CT imaging of the brain was performed following IV bolus contrast injection. Subsequent  parametric perfusion maps were calculated using RAPID software. CONTRAST:  147mL OMNIPAQUE IOHEXOL 350 MG/ML SOLN COMPARISON:  CT head 10/12/2019 FINDINGS: CTA NECK FINDINGS Aortic arch: Standard branching. Imaged portion shows no evidence of aneurysm or dissection. No significant stenosis of the major arch vessel origins. Right carotid system: Right carotid artery widely patent without stenosis or dissection. Minimal atherosclerotic disease right carotid bifurcation. Left carotid system: Left carotid widely patent without stenosis or dissection. Minimal atherosclerotic disease at the left carotid bifurcation. Vertebral arteries: Both vertebral arteries widely patent without stenosis or irregularity. Skeleton: Degenerative changes in the cervical spine with spurring at C5-6 and C6-7. Erosive changes in the dens which could be due to rheumatoid arthritis or CPPD. Other neck: Negative for mass or adenopathy in the neck. Upper chest: Apical scarring bilaterally without acute abnormality. Review of the MIP images confirms the above findings CTA HEAD FINDINGS Anterior circulation: Mild atherosclerotic disease in the cavernous carotid bilaterally without stenosis. Anterior and middle cerebral arteries patent bilaterally without stenosis. Branch occlusion of small left M3 branch involving the superior division consistent with aphasia symptoms. Posterior circulation: Both vertebral arteries patent to the basilar. PICA patent bilaterally. Basilar widely patent. Superior cerebellar and posterior cerebral arteries patent bilaterally. Fetal origin of the posterior cerebral artery bilaterally. Venous sinuses: Normal venous enhancement Anatomic variants: None Review of the MIP images confirms the above findings CT Brain Perfusion Findings: ASPECTS: 10 CBF (<30%) Volume: 47mL Perfusion (Tmax>6.0s) volume: 50mL Mismatch Volume: 11mL Infarction Location:No infarct identified. There is some delayed perfusion in the left middle frontal  gyrus and region of the speech area. This shows 11 ml of delayed perfusion on T-max greater than 4 seconds but 0 mL on the T-max greater than 6 seconds. IMPRESSION: 1. Positive for left M3 branch occlusion of the superior division left MCA 2. No significant carotid or vertebral artery stenosis in the neck. 3. CT perfusion negative for acute infarct or ischemia using the standard parameters. However on T-max greater than 4 seconds there is 11 mm of delayed perfusion in the area of the speech center. 4. These results were called by telephone at the time of interpretation on 10/12/2019 at 11:53 am to provider ERIC [facility] , who verbally acknowledged these results. Electronically Signed   By: Franchot Gallo M.D.   On: 10/12/2019 11:54   CT Code Stroke CTA Neck W/WO contrast  Result Date: 10/12/2019 CLINICAL DATA:  Acute neuro deficit. Aphasia. Last seen normal 1600 hours yesterday.  EXAM: CT ANGIOGRAPHY HEAD AND NECK CT PERFUSION BRAIN TECHNIQUE: Multidetector CT imaging of the head and neck was performed using the standard protocol during bolus administration of intravenous contrast. Multiplanar CT image reconstructions and MIPs were obtained to evaluate the vascular anatomy. Carotid stenosis measurements (when applicable) are obtained utilizing NASCET criteria, using the distal internal carotid diameter as the denominator. Multiphase CT imaging of the brain was performed following IV bolus contrast injection. Subsequent parametric perfusion maps were calculated using RAPID software. CONTRAST:  169mL OMNIPAQUE IOHEXOL 350 MG/ML SOLN COMPARISON:  CT head 10/12/2019 FINDINGS: CTA NECK FINDINGS Aortic arch: Standard branching. Imaged portion shows no evidence of aneurysm or dissection. No significant stenosis of the major arch vessel origins. Right carotid system: Right carotid artery widely patent without stenosis or dissection. Minimal atherosclerotic disease right carotid bifurcation. Left carotid system: Left carotid  widely patent without stenosis or dissection. Minimal atherosclerotic disease at the left carotid bifurcation. Vertebral arteries: Both vertebral arteries widely patent without stenosis or irregularity. Skeleton: Degenerative changes in the cervical spine with spurring at C5-6 and C6-7. Erosive changes in the dens which could be due to rheumatoid arthritis or CPPD. Other neck: Negative for mass or adenopathy in the neck. Upper chest: Apical scarring bilaterally without acute abnormality. Review of the MIP images confirms the above findings CTA HEAD FINDINGS Anterior circulation: Mild atherosclerotic disease in the cavernous carotid bilaterally without stenosis. Anterior and middle cerebral arteries patent bilaterally without stenosis. Branch occlusion of small left M3 branch involving the superior division consistent with aphasia symptoms. Posterior circulation: Both vertebral arteries patent to the basilar. PICA patent bilaterally. Basilar widely patent. Superior cerebellar and posterior cerebral arteries patent bilaterally. Fetal origin of the posterior cerebral artery bilaterally. Venous sinuses: Normal venous enhancement Anatomic variants: None Review of the MIP images confirms the above findings CT Brain Perfusion Findings: ASPECTS: 10 CBF (<30%) Volume: 17mL Perfusion (Tmax>6.0s) volume: 72mL Mismatch Volume: 88mL Infarction Location:No infarct identified. There is some delayed perfusion in the left middle frontal gyrus and region of the speech area. This shows 11 ml of delayed perfusion on T-max greater than 4 seconds but 0 mL on the T-max greater than 6 seconds. IMPRESSION: 1. Positive for left M3 branch occlusion of the superior division left MCA 2. No significant carotid or vertebral artery stenosis in the neck. 3. CT perfusion negative for acute infarct or ischemia using the standard parameters. However on T-max greater than 4 seconds there is 11 mm of delayed perfusion in the area of the speech center. 4.  These results were called by telephone at the time of interpretation on 10/12/2019 at 11:53 am to provider ERIC [facility] , who verbally acknowledged these results. Electronically Signed   By: Franchot Gallo M.D.   On: 10/12/2019 11:54   CT Code Stroke Cerebral Perfusion with contrast  Result Date: 10/12/2019 CLINICAL DATA:  Acute neuro deficit. Aphasia. Last seen normal 1600 hours yesterday. EXAM: CT ANGIOGRAPHY HEAD AND NECK CT PERFUSION BRAIN TECHNIQUE: Multidetector CT imaging of the head and neck was performed using the standard protocol during bolus administration of intravenous contrast. Multiplanar CT image reconstructions and MIPs were obtained to evaluate the vascular anatomy. Carotid stenosis measurements (when applicable) are obtained utilizing NASCET criteria, using the distal internal carotid diameter as the denominator. Multiphase CT imaging of the brain was performed following IV bolus contrast injection. Subsequent parametric perfusion maps were calculated using RAPID software. CONTRAST:  18mL OMNIPAQUE IOHEXOL 350 MG/ML SOLN COMPARISON:  CT head 10/12/2019 FINDINGS: CTA  NECK FINDINGS Aortic arch: Standard branching. Imaged portion shows no evidence of aneurysm or dissection. No significant stenosis of the major arch vessel origins. Right carotid system: Right carotid artery widely patent without stenosis or dissection. Minimal atherosclerotic disease right carotid bifurcation. Left carotid system: Left carotid widely patent without stenosis or dissection. Minimal atherosclerotic disease at the left carotid bifurcation. Vertebral arteries: Both vertebral arteries widely patent without stenosis or irregularity. Skeleton: Degenerative changes in the cervical spine with spurring at C5-6 and C6-7. Erosive changes in the dens which could be due to rheumatoid arthritis or CPPD. Other neck: Negative for mass or adenopathy in the neck. Upper chest: Apical scarring bilaterally without acute abnormality.  Review of the MIP images confirms the above findings CTA HEAD FINDINGS Anterior circulation: Mild atherosclerotic disease in the cavernous carotid bilaterally without stenosis. Anterior and middle cerebral arteries patent bilaterally without stenosis. Branch occlusion of small left M3 branch involving the superior division consistent with aphasia symptoms. Posterior circulation: Both vertebral arteries patent to the basilar. PICA patent bilaterally. Basilar widely patent. Superior cerebellar and posterior cerebral arteries patent bilaterally. Fetal origin of the posterior cerebral artery bilaterally. Venous sinuses: Normal venous enhancement Anatomic variants: None Review of the MIP images confirms the above findings CT Brain Perfusion Findings: ASPECTS: 10 CBF (<30%) Volume: 76mL Perfusion (Tmax>6.0s) volume: 77mL Mismatch Volume: 69mL Infarction Location:No infarct identified. There is some delayed perfusion in the left middle frontal gyrus and region of the speech area. This shows 11 ml of delayed perfusion on T-max greater than 4 seconds but 0 mL on the T-max greater than 6 seconds. IMPRESSION: 1. Positive for left M3 branch occlusion of the superior division left MCA 2. No significant carotid or vertebral artery stenosis in the neck. 3. CT perfusion negative for acute infarct or ischemia using the standard parameters. However on T-max greater than 4 seconds there is 11 mm of delayed perfusion in the area of the speech center. 4. These results were called by telephone at the time of interpretation on 10/12/2019 at 11:53 am to provider ERIC [facility] , who verbally acknowledged these results. Electronically Signed   By: Franchot Gallo M.D.   On: 10/12/2019 11:54   CT HEAD CODE STROKE WO CONTRAST  Result Date: 10/12/2019 CLINICAL DATA:  Code stroke. Focal neuro deficit greater than 6 hours. Rule out stroke. EXAM: CT HEAD WITHOUT CONTRAST TECHNIQUE: Contiguous axial images were obtained from the base of the skull  through the vertex without intravenous contrast. COMPARISON:  CT head 07/14/2017.  MRI head 09/27/2016 FINDINGS: Brain: Negative for acute infarct or hemorrhage. Generalized atrophy without hydrocephalus. Densely calcified right para clinoid mass measures 2.2 x 2.1 cm and is unchanged from the prior study. Second 9 mm calcified meningioma below the left tentorium also unchanged. No significant cerebral edema. Vascular: Negative for hyperdense vessel Skull: Negative Sinuses/Orbits: Paranasal sinuses clear.  Negative orbit. Other: None ASPECTS ([place] Stroke Program Early CT Score) - Ganglionic level infarction (caudate, lentiform nuclei, internal capsule, insula, M1-M3 cortex): 7 - Supraganglionic infarction (M4-M6 cortex): 3 Total score (0-10 with 10 being normal): 10 IMPRESSION: 1. No acute infarct or hemorrhage 2. ASPECTS is 10 3. Stable calcified meningioma total right para clinoid region and left tentorium. 4. Code stroke imaging results were communicated on 10/12/2019 at 11:24 am to provider Dr. Cheral Marker via Shea Evans Electronically Signed   By: Franchot Gallo M.D.   On: 10/12/2019 11:24    Procedures Procedures (including critical care time)  Medications Ordered in ED Medications  iohexol (OMNIPAQUE) 240 MG/ML injection (has no administration in time range)  aspirin 81 MG chewable tablet (has no administration in time range)  verapamil (ISOPTIN) 2.5 MG/ML injection (has no administration in time range)  ticagrelor (BRILINTA) 90 MG tablet (has no administration in time range)  clopidogrel (PLAVIX) 300 MG tablet (has no administration in time range)  tirofiban (AGGRASTAT) 5-0.9 MG/100ML-% injection (has no administration in time range)  eptifibatide (INTEGRILIN) 20 MG/10ML injection (has no administration in time range)  ceFAZolin (ANCEF) 2-4 GM/100ML-% IVPB (has no administration in time range)  nitroGLYCERIN 100 mcg/mL intra-arterial injection (has no administration in time range)  sodium chloride  flush (NS) 0.9 % injection 3 mL (3 mLs Intravenous Given 10/12/19 1142)  iohexol (OMNIPAQUE) 350 MG/ML injection 100 mL (100 mLs Intravenous Contrast Given 10/12/19 1130)  aspirin suppository 600 mg (600 mg Rectal Given 10/12/19 1155)    ED Course  I have reviewed the triage vital signs and the nursing notes.  Pertinent labs & imaging results that were available during my care of the patient were reviewed by me and considered in my medical decision making (see chart for details).    MDM Rules/Calculators/A&P                          After initial work-up, patient's CT angiography was noted to have an M3 occlusion.  Neurology and interventional radiology have discussed with family and they would like to proceed with treatment.  Neurology to admit. Final Clinical Impression(s) / ED Diagnoses Final diagnoses:  Acute ischemic stroke [facility])    Rx / DC Orders ED Discharge Orders    None       Sherwood Gambler, MD 10/12/19 1218

## 2019-10-12 NOTE — Anesthesia Procedure Notes (Signed)
Procedure Name: Intubation Date/Time: 10/12/2019 12:21 PM Performed by: Janace Litten, CRNA Pre-anesthesia Checklist: Patient identified, Emergency Drugs available, Suction available and Patient being monitored Patient Re-evaluated:Patient Re-evaluated prior to induction Oxygen Delivery Method: Circle System Utilized Preoxygenation: Pre-oxygenation with 100% oxygen Induction Type: IV induction Ventilation: Mask ventilation without difficulty Laryngoscope Size: Miller and 2 Grade View: Grade I Tube type: Oral Tube size: 7.5 mm Number of attempts: 1 Airway Equipment and Method: Stylet and Oral airway Placement Confirmation: ETT inserted through vocal cords under direct vision,  positive ETCO2 and breath sounds checked- equal and bilateral Tube secured with: Tape Dental Injury: Teeth and Oropharynx as per pre-operative assessment

## 2019-10-12 NOTE — Progress Notes (Signed)
Groin checked at bedside with PACU RN

## 2019-10-12 NOTE — Transfer of Care (Addendum)
Immediate Anesthesia Transfer of Care Note  Patient: Summer [name]  Procedure(s) Performed: IR WITH ANESTHESIA (N/A )  Patient Location: PACU  Anesthesia Type:General  Level of Consciousness: drowsy and patient cooperative  Airway & Oxygen Therapy: Patient Spontanous Breathing and Patient connected to face mask oxygen  Post-op Assessment: Report given to RN and Post -op Vital signs reviewed and stable  Post vital signs: Reviewed and stable  Last Vitals:  Vitals Value Taken Time  BP 130/89   Temp    Pulse 70 10/12/19 1339  Resp 13 10/12/19 1339  SpO2 100 % 10/12/19 1339  Vitals shown include unvalidated device data.  Last Pain:  Vitals:   10/12/19 1139  TempSrc: Oral  PainSc: 0-No pain         Complications: No complications documented.

## 2019-10-12 NOTE — Anesthesia Procedure Notes (Signed)
Arterial Line Insertion Start/End7/05/2019 12:30 PM Performed by: Hessie Dibble T, CRNA  Preanesthetic checklist: patient identified, IV checked, site marked, risks and benefits discussed, surgical consent, monitors and equipment checked, pre-op evaluation, timeout performed and anesthesia consent Left, radial was placed Catheter size: 20 G Hand hygiene performed  and maximum sterile barriers used   Attempts: 1 Procedure performed without using ultrasound guided technique. Following insertion, dressing applied and Biopatch. Post procedure assessment: normal and unchanged  Patient tolerated the procedure well with no immediate complications.

## 2019-10-12 NOTE — ED Triage Notes (Signed)
Pt from home with [facility] as a CODE STROKE. LSN 1400 yesterday by family. At 930 this morning family called the pt on the phone and she wasn't making sense on the phone. When family arrived to her house she had severe aphasia and a right sided facial droop. Pt taken to CT upon arrival .

## 2019-10-12 NOTE — Anesthesia Preprocedure Evaluation (Signed)
Anesthesia Evaluation  Patient identified by MRN, date of birth, ID band Patient awake    Reviewed: Allergy & Precautions, NPO status , Patient's Chart, lab work & pertinent test results, Unable to perform ROS - Chart review onlyPreop documentation limited or incomplete due to emergent nature of procedure.  Airway Mallampati: I  TM Distance: >3 FB Neck ROM: Full    Dental  (+) Upper Dentures, Lower Dentures   Pulmonary neg pulmonary ROS,    breath sounds clear to auscultation       Cardiovascular  Rhythm:Regular Rate:Normal     Neuro/Psych CVA    GI/Hepatic   Endo/Other    Renal/GU      Musculoskeletal   Abdominal Normal abdominal exam  (+)   Peds  Hematology   Anesthesia Other Findings   Reproductive/Obstetrics                             Anesthesia Physical Anesthesia Plan  ASA: III and emergent  Anesthesia Plan: General   Post-op Pain Management:    Induction: Intravenous, Rapid sequence and Cricoid pressure planned  PONV Risk Score and Plan: 1 and Treatment may vary due to age or medical condition and Ondansetron  Airway Management Planned: Oral ETT  Additional Equipment: Arterial line  Intra-op Plan:   Post-operative Plan: Possible Post-op intubation/ventilation  Informed Consent: I have reviewed the patients History and Physical, chart, labs and discussed the procedure including the risks, benefits and alternatives for the proposed anesthesia with the patient or authorized representative who has indicated his/her understanding and acceptance.     Only emergency history available, Consent reviewed with POA and History available from chart only  Plan Discussed with: CRNA  Anesthesia Plan Comments:         Anesthesia Quick Evaluation

## 2019-10-12 NOTE — Code Documentation (Addendum)
Code stroke paged out at 1033. Pt arrived via [facility] at 1105. Her LKW was yesterday (10/11/2019) at 1400. Her Daughter-in-law found her unable to speak this morning at 0930, at which time EMS was called. Daughter-in-law Charleen Madera provided history over phone at number [phone]. She states pt is mostly independent, lives alone, and has a mild dementia.(MRS 1-2) She also has HTN, and a history of meningioma which did not require intervention. She takes a baby aspirin and has no known allergies. Upon arrival to [facility], pt was an NIHSS of 7; unable to answer questions, mild facial asymmetry, severe aphasia, and severe dysarthria. Blood was drawn. CBG was 89. Pt was taken to CT scan at 1108. No hemorrhage per Dr Cheral Marker. CTA and CTP started at 1117. Pt was not a candidate for IV TPA because she is outside of the therapeutic window Pt returned to ED Resus room for further work up. Rectal asprin 600mg  given at 1125.(Dose confirmed with Pharmacist.) Treatment decision of diagnostic angiogram with possible intervention made by Dr Cheral Marker, Dr Estanislado Pandy and pt's Son at [phone]. Informed consent signed and witnessed. Pt taken to IR bay 8 at 1210. Groin prepped, pulses marked.Report given to CRNA and IR RNs Clarise Cruz and Shelby. Pt was intubated at 1221. Pt taken to IR suite at 1224.

## 2019-10-12 NOTE — Progress Notes (Signed)
Care turned over to [facility]

## 2019-10-12 NOTE — Progress Notes (Signed)
Belongings with patient on arrival: dentures (upper and lower) clothing in bag, two yellow colored rings placed in container. Pt's son will be taking belongings except dentures home. No paperwork brought up from PACU.

## 2019-10-12 NOTE — Plan of Care (Signed)
  Problem: Coping: Goal: Level of anxiety will decrease Outcome: Progressing   Problem: Elimination: Goal: Will not experience complications related to urinary retention Outcome: Progressing   Problem: Activity: Goal: Risk for activity intolerance will decrease Outcome: Not Progressing   Problem: Nutrition: Goal: Adequate nutrition will be maintained Outcome: Not Progressing

## 2019-10-13 ENCOUNTER — Inpatient Hospital Stay (HOSPITAL_COMMUNITY): Payer: Medicare Other

## 2019-10-13 DIAGNOSIS — I361 Nonrheumatic tricuspid (valve) insufficiency: Secondary | ICD-10-CM

## 2019-10-13 DIAGNOSIS — I340 Nonrheumatic mitral (valve) insufficiency: Secondary | ICD-10-CM

## 2019-10-13 LAB — BASIC METABOLIC PANEL
Anion gap: 10 (ref 5–15)
BUN: 18 mg/dL (ref 8–23)
CO2: 21 mmol/L — ABNORMAL LOW (ref 22–32)
Calcium: 8.1 mg/dL — ABNORMAL LOW (ref 8.9–10.3)
Chloride: 108 mmol/L (ref 98–111)
Creatinine, Ser: 1.18 mg/dL — ABNORMAL HIGH (ref 0.44–1.00)
GFR calc Af Amer: 50 mL/min — ABNORMAL LOW (ref >60)
GFR calc non Af Amer: 43 mL/min — ABNORMAL LOW (ref >60)
Glucose, Bld: 87 mg/dL (ref 70–99)
Potassium: 4 mmol/L (ref 3.5–5.1)
Sodium: 139 mmol/L (ref 135–145)

## 2019-10-13 LAB — HEMOGLOBIN A1C
Hgb A1c MFr Bld: 5.3 % (ref 4.8–5.6)
Mean Plasma Glucose: 105.41 mg/dL

## 2019-10-13 LAB — LIPID PANEL
Cholesterol: 158 mg/dL (ref 0–200)
HDL: 49 mg/dL (ref >40)
LDL Cholesterol: 96 mg/dL (ref 0–99)
Total CHOL/HDL Ratio: 3.2 RATIO
Triglycerides: 63 mg/dL (ref <150)
VLDL: 13 mg/dL (ref 0–40)

## 2019-10-13 LAB — CBC
HCT: 37.2 % (ref 36.0–46.0)
Hemoglobin: 11.7 g/dL — ABNORMAL LOW (ref 12.0–15.0)
MCH: 28.9 pg (ref 26.0–34.0)
MCHC: 31.5 g/dL (ref 30.0–36.0)
MCV: 91.9 fL (ref 80.0–100.0)
Platelets: 194 10*3/uL (ref 150–400)
RBC: 4.05 MIL/uL (ref 3.87–5.11)
RDW: 14.6 % (ref 11.5–15.5)
WBC: 9.9 10*3/uL (ref 4.0–10.5)
nRBC: 0 % (ref 0.0–0.2)

## 2019-10-13 LAB — ECHOCARDIOGRAM COMPLETE
Height: 68 in
Weight: 2261.04 oz

## 2019-10-13 MED ORDER — LABETALOL HCL 5 MG/ML IV SOLN
10.0000 mg | INTRAVENOUS | Status: DC | PRN
  Filled 2019-10-13: qty 4

## 2019-10-13 MED ORDER — LABETALOL HCL 5 MG/ML IV SOLN: 10.0000 mg | INTRAVENOUS | Status: DC | PRN

## 2019-10-13 MED ORDER — ASPIRIN EC 81 MG PO TBEC
81.0000 mg | DELAYED_RELEASE_TABLET | Freq: Every day | ORAL | Status: DC
  Administered 2019-10-13 – 2019-10-16 (×4): 81 mg via ORAL
  Filled 2019-10-13 (×4): qty 1

## 2019-10-13 NOTE — Progress Notes (Addendum)
Patient ID: Summer [name], female   DOB: 10-03-1934, 85 y.o.   MRN: [id number] Cerebral angio performed yesterday by Dr. Estanislado Pandy . Per Dr. Estanislado Pandy, findings revealed no angiographic evidence of large vessel occlusion. There was distal posterior parasylvian M4 branch occlusion. No endovascular intervention performed. No immediate postprocedure complications.

## 2019-10-13 NOTE — Evaluation (Signed)
Clinical/Bedside Swallow Evaluation Patient Details  Name: Summer [name] MRN: [id number] Date of Birth: 1934/12/09  Today's Date: 10/13/2019 Time: SLP Start Time (ACUTE ONLY): 0900 SLP Stop Time (ACUTE ONLY): 0935 SLP Time Calculation (min) (ACUTE ONLY): 35 min  Past Medical History: History reviewed. No pertinent past medical history. Past Surgical History: History reviewed. No pertinent surgical history. HPI:  85 y.o. female with a prior history of mild dementia who presents to the ED as a Code Stroke for assessment of acute onset aphasia. LKN was 1400 10/12/19, when her son spoke to her on the telephone. Daughter in law visited at [place] 10/12/19 for the "morning check up" and noticed that patient would not speak to her - daughter in law thought that she was just waking up. However, when the phone rang and patient tried to answer it, her speech was garbled. Daughter in law called 72. On EMS arrival she followed all commands, was able to walk, but could not speak, except for "Ok" and otherwise garbled speech - left side of mouth would move when trying to speak, but right side of mouth would not move. Code Stroke was called en route; MRI brain on 10/13/19 revealed: Approximately 3 cm infarct in the left inferior frontal gyrus and operculum with no hemorrhage or mass effect. This closely resembles the T-max > 4s abnormality on CTP yesterday.  2. Chronic 2.2 cm right supraclinoid meningioma. Evidence of mild new cerebral edema in the adjacent right inferior frontal gyrus since a 2018 MRI. No increased mass effect. Stable much smaller chronic left tentorium meningioma. 3. 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    Assessment / Plan / Recommendation Clinical Impression  Pt exhibits oropharyngeal dysphagia characterized by delayed cough with thin via straw, but no difficulty noted with thin via cup sips with mod verbal cues to swallow "small" sip during consumption; Pt has presbyphagia  and cognitive-based swallowing deficits as well including oral holding/delayed swallow initation with a liquid wash required and verbal cues to complete a lingual sweep after consumption of solids; recommend initiating a Dysphagia 3 (chopped) diet with thin liquids using small sips only/no straw; ST will f/u for diet tolerance and cognitive/speech/linguistic needs while in acute setting. SLP Visit Diagnosis: Cognitive communication deficit (R41.841);Aphasia (R47.01);Dysarthria and anarthria (R47.1)    Aspiration Risk    Mild-moderate   Diet Recommendation   Dysphagia 3(chopped)/thin liquids  Medication Administration: Whole meds with puree    Other  Recommendations Oral Care Recommendations: Oral care BID   Follow up Recommendations Outpatient SLP;24 hour supervision/assistance      Frequency and Duration min 2x/week  2 weeks       Prognosis Prognosis for Safe Diet Advancement: Good Barriers to Reach Goals: Cognitive deficits      Swallow Study   General Date of Onset: 10/12/19 HPI: 85 y.o. female with a prior history of mild dementia who presents to the ED as a Code Stroke for assessment of acute onset aphasia. LKN was 71 yesterday, when her son spoke to her on the telephone. Daughter in law visited at [place] today for the "morning check up" and noticed that patient would not speak to her - daughter in law thought that she was just waking up. However, when the phone rang and patient tried to answer it, her speech was garbled. Daughter in law called 1. On EMS arrival she followed all commands, was able to walk, but could not speak, except for "Ok" and otherwise garbled  speech - left side of mouth would move when trying to speak, but right side of mouth would not move. Code Stroke was called en route.  Type of Study: Bedside Swallow Evaluation Previous Swallow Assessment:  (10/12/19 failed Yale swallow screen) Diet Prior to this Study: NPO Temperature Spikes Noted: No Respiratory  Status: Room air History of Recent Intubation: No Behavior/Cognition: Alert;Cooperative;Pleasant mood;Confused;Requires cueing Oral Cavity Assessment: Within Functional Limits Oral Care Completed by SLP: Recent completion by staff Oral Cavity - Dentition: Dentures, top;Dentures, bottom Vision: Functional for self-feeding Self-Feeding Abilities: Able to feed self;Needs assist Patient Positioning: Upright in bed Baseline Vocal Quality: Low vocal intensity Volitional Cough: Strong Volitional Swallow: Able to elicit    Oral/Motor/Sensory Function Overall Oral Motor/Sensory Function: Mild impairment Facial ROM: Within Functional Limits Facial Symmetry: Within Functional Limits Facial Strength: Within Functional Limits Facial Sensation: Within Functional Limits Lingual ROM: Reduced right;Reduced left Lingual Symmetry: Abnormal symmetry left Lingual Strength: Reduced Mandible: Impaired;Other (Comment) (tremoring noted during OME)   Ice Chips Ice chips: Within functional limits Presentation: Spoon   Thin Liquid Thin Liquid: Impaired Presentation: Cup;Straw Pharyngeal  Phase Impairments: Suspected delayed Swallow;Multiple swallows;Throat Clearing - Delayed;Cough - Immediate;Cough - Delayed    Nectar Thick Nectar Thick Liquid: Not tested   Honey Thick Honey Thick Liquid: Not tested   Puree Puree: Within functional limits Presentation: Self Fed;Spoon   Solid     Solid: Impaired Presentation: Self Fed Oral Phase Impairments: Reduced lingual movement/coordination;Impaired mastication Oral Phase Functional Implications: Prolonged oral transit;Oral residue;Other (comment) (cleared with liquid wash/lingual sweep) Pharyngeal Phase Impairments: Suspected delayed Swallow      Elvina Sidle, M.S.,CCC-SLP 10/13/2019,12:31 PM

## 2019-10-13 NOTE — Evaluation (Signed)
Physical Therapy Evaluation Patient Details Name: Summer [name] MRN: [id number] DOB: 1934/07/02 Today's Date: 10/13/2019   History of Present Illness  85 y.o. female with a prior history of mild dementia who presents to the ED as a Code Stroke for assessment of acute onset aphasia. On EMS arrival she followed all commands, was able to walk, but could not speak, except for "Ok" and otherwise garbled speech. CTA demonstrates L M3 branch occlusion of L MCA. PT underwent cerebral angiogram on 7/2 for diagnosis and treatment of occlusion. PMH significant for dementia and HTN.  Clinical Impression  Pt presents to PT with deficits in functional mobility, gait, balance, communication and with chronic cognitive deficits. Pt mobilizes well this session with supervision for bed level and minG without UE support for all OOB activity. Pt requires verbal cues for direction 2/2 cognitive deficits. Pt demonstrates no significant balance deviations at this time, able to pick up an object off the floor without physical assistance. Pt will benefit from continued acute PT services to improve balance and safety in the home setting and to reduce falls risk. PT recommends discharge home with HHPT and 24/7 supervision from family. PT will continue to assess DME needs during admission.    Follow Up Recommendations Home health PT;Supervision/Assistance - 24 hour    Equipment Recommendations   (TBD)    Recommendations for Other Services       Precautions / Restrictions Precautions Precautions: Fall Restrictions Weight Bearing Restrictions: No      Mobility  Bed Mobility Overal bed mobility: Needs Assistance Bed Mobility: Supine to Sit     Supine to sit: Supervision        Transfers Overall transfer level: Needs assistance Equipment used: None Transfers: Sit to/from Stand Sit to Stand: Min guard            Ambulation/Gait Ambulation/Gait assistance: Min guard Gait Distance (Feet): 20  Feet Assistive device: None Gait Pattern/deviations: Step-to pattern Gait velocity: reduced Gait velocity interpretation: <1.8 ft/sec, indicate of risk for recurrent falls General Gait Details: pt with short step to gait pattern, some high guard during session reaching for objects for support intermittently but no significant losses of balance.  Stairs            Wheelchair Mobility    Modified Rankin (Stroke Patients Only) Modified Rankin (Stroke Patients Only) Pre-Morbid Rankin Score: Slight disability Modified Rankin: Moderately severe disability     Balance Overall balance assessment: Needs assistance Sitting-balance support: No upper extremity supported;Feet supported Sitting balance-Leahy Scale: Good Sitting balance - Comments: supervision, reaching outside of BOS   Standing balance support: No upper extremity supported;During functional activity Standing balance-Leahy Scale: Good Standing balance comment: reaches to pick up tissue off floor with supervision                             Pertinent Vitals/Pain Pain Assessment: No/denies pain    Home Living Family/patient expects to be discharged to:: Private residence Living Arrangements: Spouse/significant other (pt lives alone at baseline, son plans to live with her at [place]) Available Help at Discharge: Family;Available 24 hours/day Type of Home: House Home Access: Stairs to enter Entrance Stairs-Rails: None Entrance Stairs-Number of Steps: 1 Home Layout: One level Home Equipment: Grab bars - toilet;Grab bars - tub/shower      Prior Function Level of Independence: Needs assistance   Gait / Transfers Assistance Needed: independent with mobility  ADL's / Homemaking Assistance Needed: pt  requires assistance for all IADLs 2/2 dementia        Hand Dominance        Extremity/Trunk Assessment   Upper Extremity Assessment Upper Extremity Assessment: Overall WFL for tasks assessed    Lower  Extremity Assessment Lower Extremity Assessment: Overall WFL for tasks assessed    Cervical / Trunk Assessment Cervical / Trunk Assessment: Normal  Communication   Communication: Expressive difficulties (dysarthric speech)  Cognition Arousal/Alertness: Awake/alert Behavior During Therapy: WFL for tasks assessed/performed Overall Cognitive Status: History of cognitive impairments - at baseline                                 General Comments: pt with history of dementia, is oriented to self, place, not to situation      General Comments General comments (skin integrity, edema, etc.): VSS on RA    Exercises     Assessment/Plan    PT Assessment Patient needs continued PT services  PT Problem List Decreased balance;Decreased mobility;Decreased cognition;Decreased knowledge of use of DME;Decreased safety awareness;Decreased knowledge of precautions       PT Treatment Interventions DME instruction;Gait training;Stair training;Functional mobility training;Therapeutic activities;Therapeutic exercise;Balance training;Neuromuscular re-education;Cognitive remediation;Patient/family education    PT Goals (Current goals can be found in the Care Plan section)  Acute Rehab PT Goals Patient Stated Goal: To go home PT Goal Formulation: With patient/family Time For Goal Achievement: 10/27/19 Potential to Achieve Goals: Good Additional Goals Additional Goal #1: Pt will score >19/24 on DGI to inidicate a reduced falls risk    Frequency Min 4X/week   Barriers to discharge        Co-evaluation               AM-PAC PT "6 Clicks" Mobility  Outcome Measure Help needed turning from your back to your side while in a flat bed without using bedrails?: None Help needed moving from lying on your back to sitting on the side of a flat bed without using bedrails?: None Help needed moving to and from a bed to a chair (including a wheelchair)?: A Little Help needed standing up  from a chair using your arms (e.g., wheelchair or bedside chair)?: A Little Help needed to walk in hospital room?: A Little Help needed climbing 3-5 steps with a railing? : A Lot 6 Click Score: 19    End of Session   Activity Tolerance: Patient tolerated treatment well Patient left: in bed;with call bell/phone within reach;with family/visitor present;with nursing/sitter in room (heading to MRI) Nurse Communication: Mobility status PT Visit Diagnosis: Other abnormalities of gait and mobility (R26.89);Unsteadiness on feet (R26.81)    Time: 0109-3235 PT Time Calculation (min) (ACUTE ONLY): 34 min   Charges:   PT Evaluation $PT Eval Moderate Complexity: 1 Mod PT Treatments $Gait Training: 8-22 mins        Zenaida Niece, PT, DPT Acute Rehabilitation Pager: [phone]   Zenaida Niece 10/13/2019, 11:00 AM

## 2019-10-13 NOTE — Evaluation (Signed)
Speech Language Pathology Evaluation Patient Details Name: NATALIJA MAVIS MRN: [id number] DOB: 25-Sep-1934 Today's Date: 10/13/2019 Time: 2130-8657 SLP Time Calculation (min) (ACUTE ONLY): 35 min  Problem List:  Patient Active Problem List   Diagnosis Date Noted   Acute CVA (cerebrovascular accident) ([place]) 10/12/2019   Stroke (cerebrum) ([place]) 10/12/2019   Middle cerebral artery embolism, left 10/12/2019   Past Medical History: History reviewed. No pertinent past medical history. Past Surgical History: History reviewed. No pertinent surgical history. HPI:  85 y.o. female with a prior history of mild dementia who presents to the ED as a Code Stroke for assessment of acute onset aphasia. LKN was 1400 on 10/12/19, when her son spoke to her on the telephone. Daughter in law visited at [place] on 10/12/19 for the "morning check up" and noticed that patient would not speak to her - daughter in law thought that she was just waking up. However, when the phone rang and patient tried to answer it, her speech was garbled. Daughter in law called 88. On EMS arrival she followed all commands, was able to walk, but could not speak, except for "Ok" and otherwise garbled speech - left side of mouth would move when trying to speak, but right side of mouth would not move. Code Stroke was called en route.  MRI head on 10/13/19 indicated: Approximately 3 cm infarct in the left inferior frontal gyrus and operculum with no hemorrhage or mass effect. This closely resembles the T-max > 4s abnormality on CTP yesterday.  2. Chronic 2.2 cm right supraclinoid meningioma. Evidence of mild new cerebral edema in the adjacent right inferior frontal gyrus since a 2018 MRI. No increased mass effect. Stable much smaller chronic left tentorium meningioma.  3. 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  Assessment / Plan / Recommendation Clinical Impression  Pt presents with baseline dementia per son with noted  change in speech intelligibility as her speech was dysarthric with 50-75% intelligibility within simple conversational tasks; aphasia is new per son as well as pt able to name items confrontationally with 50-75% accuracy during SLE, but had difficulty with divergent/convergent naming and intermittently within conversation.  Pt able to follow simple directives with min-mod verbal cueing to maintain attention to tasks; HOH at baseline which also impacts overall auditory comprehension tasks; oriented to self only; yes/no questions 75% accurate; OME revealed min mandibular tremoring and decreased lingual ROM; deviation to left with protrusion and overall reduced oral strength/coordination; ST will f/u for return to baseline cognitive functioning/speech/linguistic changes as well as, diet tolerance.  Thank you for this consult.    SLP Assessment  SLP Recommendation/Assessment: Patient needs continued Speech Language Pathology Services SLP Visit Diagnosis: Cognitive communication deficit (R41.841);Aphasia (R47.01);Dysarthria and anarthria (R47.1)    Follow Up Recommendations  Outpatient SLP;24 hour supervision/assistance    Frequency and Duration min 2x/week  2 weeks      SLP Evaluation Cognition  Overall Cognitive Status: History of cognitive impairments - at baseline Arousal/Alertness: Awake/alert Orientation Level: Oriented to person;Disoriented to place;Disoriented to time;Disoriented to situation Attention: Sustained Sustained Attention: Impaired Sustained Attention Impairment: Verbal basic;Functional basic Memory: Impaired Memory Impairment: Decreased short term memory;Decreased recall of new information;Retrieval deficit Decreased Short Term Memory: Verbal basic;Functional basic Awareness: Impaired Awareness Impairment: Emergent impairment;Intellectual impairment Behaviors: Perseveration Safety/Judgment: Impaired       Comprehension  Auditory Comprehension Overall Auditory  Comprehension: Impaired at baseline Commands: Impaired Multistep Basic Commands: 0-24% accurate Conversation: Simple Interfering Components: Working Marine scientist;Attention EffectiveTechniques:  Extra processing time;Increased volume;Visual/Gestural cues;Repetition Visual Recognition/Discrimination Discrimination: Not tested Reading Comprehension Reading Status: Not tested    Expression Expression Primary Mode of Expression: Verbal Verbal Expression Overall Verbal Expression: Impaired Initiation: No impairment Level of Generative/Spontaneous Verbalization: Conversation Repetition: Impaired Level of Impairment: Sentence level Naming: Impairment Responsive: 51-75% accurate Confrontation: Impaired Convergent: 25-49% accurate Divergent: 0-24% accurate Pragmatics: Unable to assess Effective Techniques: Sentence completion Non-Verbal Means of Communication: Not applicable Written Expression Dominant Hand: Right Written Expression: Not tested   Oral / Motor  Oral Motor/Sensory Function Overall Oral Motor/Sensory Function: Mild impairment Facial ROM: Within Functional Limits Facial Symmetry: Within Functional Limits Facial Strength: Within Functional Limits Facial Sensation: Within Functional Limits Lingual ROM: Reduced right Lingual Symmetry: Abnormal symmetry left Lingual Strength: Reduced Mandible: Impaired;Other (Comment) (tremoring noted during OME) Motor Speech Overall Motor Speech: Appears within functional limits for tasks assessed Respiration: Within functional limits Phonation: Normal Resonance: Within functional limits Articulation: Impaired Level of Impairment: Phrase Intelligibility: Intelligibility reduced Word: 25-49% accurate Phrase: 25-49% accurate Sentence: 25-49% accurate Conversation: 25-49% accurate Motor Planning: Witnin functional limits Motor Speech Errors: Not applicable Interfering Components: Hearing loss Effective Techniques: Slow  rate;Over-articulate;Increased vocal intensity                       Elvina Sidle, M.S., CCC-SLP 10/13/2019, 12:12 PM

## 2019-10-13 NOTE — Progress Notes (Signed)
°  Echocardiogram 2D Echocardiogram has been performed.  Summer [name] 10/13/2019, 1:54 PM

## 2019-10-13 NOTE — Progress Notes (Addendum)
STROKE TEAM PROGRESS NOTE   HISTORY OF PRESENT ILLNESS (per record) Summer [name] is an 85 y.o. female with a prior history of mild dementia who presents to the ED as a Code Stroke for assessment of acute onset aphasia. LKN was 73 yesterday, when her son spoke to her on the telephone. Daughter in law visited at [place] today for the "morning check up" and noticed that patient would not speak to her - daughter in law thought that she was just waking up. However, when the phone rang and patient tried to answer it, her speech was garbled. Daughter in law called 81. On EMS arrival she followed all commands, was able to walk, but could not speak, except for "Ok" and otherwise garbled speech - left side of mouth would move when trying to speak, but right side of mouth would not move. Code Stroke was called en route.  On arrival to the ED, she continued to be aphasic.  LSN: 1400 yesterday tPA Given: No: Out of tPA time window NIHSS: 4 mRS: 1   INTERVAL HISTORY Her son is at the bedside.  IR was unable to reach the left M3 and do any intervention.  Son says her speech has improved some.      OBJECTIVE Vitals:   10/13/19 0030 10/13/19 0045 10/13/19 0100 10/13/19 0300  BP: 132/68  127/62   Pulse: 73 65 (!) 59   Resp: 14 13 12    Temp:    98.4 F (36.9 C)  TempSrc:    Oral  SpO2: 99% 98% 98%   Weight:      Height:        CBC:  Recent Labs  Lab 10/12/19 1111 10/12/19 1115  WBC 7.5  --   NEUTROABS 4.5  --   HGB 12.2 12.6  HCT 39.5 37.0  MCV 92.7  --   PLT 197  --     Basic Metabolic Panel:  Recent Labs  Lab 10/12/19 1111 10/12/19 1115  NA 139 141  K 4.6 4.8  CL 105 104  CO2 26  --   GLUCOSE 92 88  BUN 23 28*  CREATININE 1.46* 1.60*  CALCIUM 9.0  --     Lipid Panel: No results found for: CHOL, TRIG, HDL, CHOLHDL, VLDL, LDLCALC HgbA1c: No results found for: HGBA1C Urine Drug Screen: No results found for: LABOPIA, COCAINSCRNUR, LABBENZ, AMPHETMU, THCU, LABBARB  Alcohol  Level No results found for: [facility]  IMAGING  CT Code Stroke CTA Head W/WO contrast CT Code Stroke CTA Neck W/WO contrast CT Code Stroke Cerebral Perfusion with contrast 10/12/2019 IMPRESSION:  1. Positive for left M3 branch occlusion of the superior division left MCA  2. No significant carotid or vertebral artery stenosis in the neck.  3. CT perfusion negative for acute infarct or ischemia using the standard parameters. However on T-max greater than 4 seconds there is 11 mm of delayed perfusion in the area of the speech center.   CT HEAD CODE STROKE WO CONTRAST 10/12/2019 IMPRESSION:  1. No acute infarct or hemorrhage  2. ASPECTS is 10  3. Stable calcified meningioma total right para clinoid region and left tentorium.   MRI Brain WO Contrast - pending 10/13/19  Neuro Interventional Radiology - Cerebral Angiogram with Intervention - Dr Estanislado Pandy - report pending 10/12/19 - 12:24   Transthoracic Echocardiogram  00/00/2021 Pending  ECG - SR rate 78 BPM. (See cardiology reading for complete details)   PHYSICAL EXAM Blood pressure 127/62, pulse (!) 59, temperature  98.4 F (36.9 C), temperature source Oral, resp. rate 12, height 5\' 8"  (1.727 m), weight 64.1 kg, SpO2 98 %.  Awake, alert, very pleasant. Dysarthria. Fluent. Comprehension- impaired about 50%, but ?hearing as issue too. Naming- intact. Repetition-impaired. Disoriented to year, month, date, day, place. PERL, EOMI. Tongue midline. Face- very mild right lower facial droop. Strength 5/5 BUE and BLE. Coord- intact.        ASSESSMENT/PLAN Summer [name] is a 85 y.o. female with history of mild dementia and hypertension presenting with acute onset aphasia. She did not receive IV t-PA due to late presentation (>4.5 hours from time of onset). Neuro Interventional Radiology - Cerebral Angiogram with Intervention - Dr Estanislado Pandy - 10/12/19 - 12:24 - report pending  Will need escalation of antiplatelet therapy if follow up CT  at 24 hours is negative for hemorrhagic conversion - per Dr Yvetta Coder consult note.  Stroke: left MCA territory infarct - embolic - source unknown  Resultant  aphasia  Code Stroke CT Head -  No acute infarct or hemorrhage. ASPECTS is 10. Stable calcified meningioma total right para clinoid region and left tentorium.   CT head - not ordered  MRI head - pending  MRA head - not ordered  CTA H&N - Positive for left M3 branch occlusion of the superior division left MCA. No significant carotid or vertebral artery stenosis in the neck.   CT Perfusion - CT perfusion negative for acute infarct or ischemia using the standard parameters. However on T-max greater than 4 seconds there is 11 mm of delayed perfusion in the area of the speech center.   Carotid Doppler - CTA neck performed - carotid dopplers not indicated.  2D Echo - pending  Lacey Jensen Virus 2 - negative  LDL - pending  HgbA1c - pending  UDS - not ordered  VTE prophylaxis - SCDs Diet  Diet Order            Diet NPO time specified  Diet effective now                 aspirin 81 mg daily prior to admission, now on No antithrombotic  (Will need escalation of antiplatelet therapy if follow up CT at 24 hours is negative for hemorrhagic conversion - per Dr Yvetta Coder consult note)  Patient will counseled to be compliant with her antithrombotic medications  Ongoing aggressive stroke risk factor management  Therapy recommendations:  pending  Disposition:  Pending  Hypertension  Home BP meds: Zestril  Current BP meds: Cleviprex ; Zestril (NPO)  Stable . Permissive hypertension but gradually normalize in 5-7 days - initial BP parameters per IR . Long-term BP goal normotensive  Hyperlipidemia  Home Lipid lowering medication: none   LDL - pending, goal < 70  Current lipid lowering medication: none / NPO   Continue statin at discharge  Other Stroke Risk Factors  Advanced age  Family hx stroke - not on  file  Other Active Problems  Code status - Full code    Mild dementia  NPO  CKD - stage 3b - creatinine - 1.46->1.60 (repeat pending)  PLAN  Await MRI - antiplatelet therapy if no bleed. (await IR recommendations)  Follow creatinine s/p contrast  BP parameters per IR (currently well controlled)  Hospital day # 1  Assessment/Plan:  Aphasia secondary to distal left MCA occlusion.  Unfortunately, unable to recanalize in IR.  Seems to be naturally improving to some degree.  I will await MRI to see extent  of infarct.  I will re-start ASA 81 mg po qday.    Rogue Jury, MS, MD  To contact Stroke Continuity provider, please refer to [email or web address] After hours, contact General Neurology

## 2019-10-14 LAB — BASIC METABOLIC PANEL
Anion gap: 9 (ref 5–15)
BUN: 15 mg/dL (ref 8–23)
CO2: 24 mmol/L (ref 22–32)
Calcium: 8.8 mg/dL — ABNORMAL LOW (ref 8.9–10.3)
Chloride: 108 mmol/L (ref 98–111)
Creatinine, Ser: 1.24 mg/dL — ABNORMAL HIGH (ref 0.44–1.00)
GFR calc Af Amer: 47 mL/min — ABNORMAL LOW (ref >60)
GFR calc non Af Amer: 40 mL/min — ABNORMAL LOW (ref >60)
Glucose, Bld: 89 mg/dL (ref 70–99)
Potassium: 3.9 mmol/L (ref 3.5–5.1)
Sodium: 141 mmol/L (ref 135–145)

## 2019-10-14 LAB — CBC
HCT: 37.3 % (ref 36.0–46.0)
Hemoglobin: 11.5 g/dL — ABNORMAL LOW (ref 12.0–15.0)
MCH: 28.4 pg (ref 26.0–34.0)
MCHC: 30.8 g/dL (ref 30.0–36.0)
MCV: 92.1 fL (ref 80.0–100.0)
Platelets: 183 10*3/uL (ref 150–400)
RBC: 4.05 MIL/uL (ref 3.87–5.11)
RDW: 14.7 % (ref 11.5–15.5)
WBC: 7.8 10*3/uL (ref 4.0–10.5)
nRBC: 0 % (ref 0.0–0.2)

## 2019-10-14 MED ORDER — QUETIAPINE FUMARATE 25 MG PO TABS
25.0000 mg | ORAL_TABLET | Freq: Every day | ORAL | Status: DC
  Filled 2019-10-14: qty 1

## 2019-10-14 MED ORDER — QUETIAPINE FUMARATE 25 MG PO TABS
25.0000 mg | ORAL_TABLET | Freq: Every day | ORAL | Status: DC
  Administered 2019-10-14 (×3): 25 mg via ORAL
  Filled 2019-10-14: qty 1

## 2019-10-14 MED ORDER — ATORVASTATIN CALCIUM 40 MG PO TABS
40.0000 mg | ORAL_TABLET | Freq: Every day | ORAL | Status: DC
  Administered 2019-10-14 – 2019-10-16 (×3): 40 mg via ORAL
  Filled 2019-10-14 (×3): qty 1

## 2019-10-14 NOTE — Progress Notes (Signed)
STROKE TEAM PROGRESS NOTE   HISTORY OF PRESENT ILLNESS (per record) Summer [name] is an 85 y.o. female with a prior history of mild dementia who presents to the ED as a Code Stroke for assessment of acute onset aphasia. LKN was 60 yesterday, when her son spoke to her on the telephone. Daughter in law visited at [place] today for the "morning check up" and noticed that patient would not speak to her - daughter in law thought that she was just waking up. However, when the phone rang and patient tried to answer it, her speech was garbled. Daughter in law called 65. On EMS arrival she followed all commands, was able to walk, but could not speak, except for "Ok" and otherwise garbled speech - left side of mouth would move when trying to speak, but right side of mouth would not move. Code Stroke was called en route.  On arrival to the ED, she continued to be aphasic.  LSN: 1400 yesterday tPA Given: No: Out of tPA time window NIHSS: 4 mRS: 1   INTERVAL HISTORY Her son is at the bedside.  MRI Brain showed left frontal operculum infarct, an old left cerebellar infarct, and a right supraclinoid meningioma.  TTE was negative.  Cr slightly increased to 1.24  OBJECTIVE Vitals:   10/14/19 0300 10/14/19 0400 10/14/19 0500 10/14/19 0515  BP: (!) 151/79   (!) 157/74  Pulse: 69 87 60 63  Resp: 16 19 13 15   Temp:  98.7 F (37.1 C)    TempSrc:  Oral    SpO2: 97% 98% 98% 98%  Weight:      Height:        CBC:  Recent Labs  Lab 10/12/19 1111 10/12/19 1115 10/13/19 0700 10/14/19 0401  WBC 7.5   < > 9.9 7.8  NEUTROABS 4.5  --   --   --   HGB 12.2   < > 11.7* 11.5*  HCT 39.5   < > 37.2 37.3  MCV 92.7   < > 91.9 92.1  PLT 197   < > 194 183   < > = values in this interval not displayed.    Basic Metabolic Panel:  Recent Labs  Lab 10/13/19 0700 10/14/19 0401  NA 139 141  K 4.0 3.9  CL 108 108  CO2 21* 24  GLUCOSE 87 89  BUN 18 15  CREATININE 1.18* 1.24*  CALCIUM 8.1* 8.8*    Lipid  Panel:     Component Value Date/Time   CHOL 158 10/13/2019 0445   TRIG 63 10/13/2019 0445   HDL 49 10/13/2019 0445   CHOLHDL 3.2 10/13/2019 0445   VLDL 13 10/13/2019 0445   LDLCALC 96 10/13/2019 0445   HgbA1c:  Lab Results  Component Value Date   HGBA1C 5.3 10/13/2019   Urine Drug Screen: No results found for: LABOPIA, COCAINSCRNUR, LABBENZ, AMPHETMU, THCU, LABBARB  Alcohol Level No results found for: [place]  IMAGING  CT Code Stroke CTA Head W/WO contrast CT Code Stroke CTA Neck W/WO contrast CT Code Stroke Cerebral Perfusion with contrast 10/12/2019 IMPRESSION:  1. Positive for left M3 branch occlusion of the superior division left MCA  2. No significant carotid or vertebral artery stenosis in the neck.  3. CT perfusion negative for acute infarct or ischemia using the standard parameters. However on T-max greater than 4 seconds there is 11 mm of delayed perfusion in the area of the speech center.   CT HEAD CODE STROKE WO CONTRAST 10/12/2019  IMPRESSION:  1. No acute infarct or hemorrhage  2. ASPECTS is 10  3. Stable calcified meningioma total right para clinoid region and left tentorium.   MRI Brain WO Contrast  10/13/19 IMPRESSION: 1. Approximately 3 cm infarct in the left inferior frontal gyrus and operculum with no hemorrhage or mass effect. This closely resembles the T-max > 4s abnormality on CTP yesterday. 2. Chronic 2.2 cm right supraclinoid meningioma. Evidence of mild new cerebral edema in the adjacent right inferior frontal gyrus since a 2018 MRI. No increased mass effect. Stable much smaller chronic left tentorium meningioma. 3. 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  Neuro Interventional Radiology - Cerebral Angiogram with Intervention - Dr Estanislado Pandy  10/12/19 - 12:24  Preliminary Report Cerebral angio performed. Per Dr. Estanislado Pandy, findings revealed no angiographic evidence of large vessel occlusion. There was distal posterior parasylvian M4  branch occlusion. No endovascular intervention performed. No immediate postprocedure complications.   Transthoracic Echocardiogram  7/3//2021 IMPRESSIONS  1. Left ventricular ejection fraction, by estimation, is 55 to 60%. The  left ventricle has normal function. The left ventricle has no regional  wall motion abnormalities. Left ventricular diastolic parameters are  consistent with Grade I diastolic  dysfunction (impaired relaxation). Elevated left atrial pressure.  2. Right ventricular systolic function is normal. The right ventricular  size is normal. There is mildly elevated pulmonary artery systolic  pressure.  3. The mitral valve is normal in structure. Mild to moderate mitral valve  regurgitation. No evidence of mitral stenosis.  4. The aortic valve is tricuspid. Aortic valve regurgitation is not  visualized. Mild aortic valve sclerosis is present, with no evidence of  aortic valve stenosis.  5. The inferior vena cava is normal in size with greater than 50%  respiratory variability, suggesting right atrial pressure of 3 mmHg.   ECG - SR rate 78 BPM. (See cardiology reading for complete details)   PHYSICAL EXAM Blood pressure (!) 157/74, pulse 63, temperature 98.7 F (37.1 C), temperature source Oral, resp. rate 15, height 5\' 8"  (1.727 m), weight 64.1 kg, SpO2 98 %.  Awake, alert, very pleasant. Dysarthria. Non-Fluent. Comprehension- impaired about 50%, but ?hearing as issue too. Naming- intact. Repetition-impaired. Disoriented to year, month, date, day, place. PERL, EOMI. Tongue midline. Face- very mild right lower facial droop. Strength 5/5 BUE and BLE. Coord- intact.     ASSESSMENT/PLAN Summer [name] is a 85 y.o. female with history of mild dementia and hypertension presenting with acute onset aphasia. She did not receive IV t-PA due to late presentation (>4.5 hours from time of onset). Neuro Interventional Radiology - Cerebral Angiogram with Intervention  - Dr Estanislado Pandy - 10/12/19 - 12:24 - no LVO - distal posterior parasylvian M4 branch occlusion - no intervention Will need escalation of antiplatelet therapy if follow up CT at 24 hours is negative for hemorrhagic conversion - per Dr Yvetta Coder consult note.  Stroke: left MCA territory infarct - embolic - source unknown  Resultant  aphasia  Code Stroke CT Head -  No acute infarct or hemorrhage. ASPECTS is 10. Stable calcified meningioma total right para clinoid region and left tentorium.   CT head - not ordered  MRI head - Approximately 3 cm infarct in the left inferior frontal gyrus and operculum with no hemorrhage or mass effect. 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  MRA head - not ordered  CTA H&N - Positive for left M3 branch occlusion of  the superior division left MCA. No significant carotid or vertebral artery stenosis in the neck.   CT Perfusion - CT perfusion negative for acute infarct or ischemia using the standard parameters. However on T-max greater than 4 seconds there is 11 mm of delayed perfusion in the area of the speech center.   Carotid Doppler - CTA neck performed - carotid dopplers not indicated.  2D Echo - EF 55 - 60%. No cardiac source of emboli identified.   Sars Corona Virus 2 - negative  LDL - 96  HgbA1c - 5.3  UDS - not ordered  VTE prophylaxis - SCDs Diet  Diet Order            DIET DYS 3 Room service appropriate? Yes; Fluid consistency: Thin  Diet effective now                 aspirin 81 mg daily prior to admission, now on aspirin 81 mg daily  Patient will counseled to be compliant with her antithrombotic medications  Ongoing aggressive stroke risk factor management  Therapy recommendations:  HH PT recommended  Disposition:  Pending  Hypertension  Home BP meds: Zestril  Current BP meds: Cleviprex ; Zestril (NPO)  Stable . Permissive hypertension but gradually normalize in 5-7 days  . Long-term BP goal  normotensive  Hyperlipidemia  Home Lipid lowering medication: none   LDL - 96, goal < 70  Current lipid lowering medication: add Lipitor 40 mg daily  Continue statin at discharge  Other Stroke Risk Factors  Advanced age  Family hx stroke - not on file  Previous stroke by imaging  Other Active Problems  Code status - Full code    Mild dementia -> now on Seroquel Q HS CKD - stage 3b - creatinine - 1.46->1.60->1.18->1.24   Hospital day # 2  Assessment/Plan:  Broca's type aphasia secondary to left frontal infarct.   I will continue ASA 81 mg po qday and Lipitor.  BP med can be adjusted as needed.  She is stable for transfer to the floor.    Rogue Jury, MS, MD  To contact Stroke Continuity provider, please refer to [email or web address] After hours, contact General Neurology

## 2019-10-14 NOTE — Plan of Care (Signed)
?  Problem: Coping: ?Goal: Level of anxiety will decrease ?Outcome: Progressing ?  ?Problem: Safety: ?Goal: Ability to remain free from injury will improve ?Outcome: Progressing ?  ?

## 2019-10-14 NOTE — Progress Notes (Signed)
OT Cancellation Note  Patient Details Name: Summer [name] MRN: [id number] DOB: [birth year]   Cancelled Treatment:    Reason Eval/Treat Not Completed: Other (comment) (Pt beeing transported to different floor at time OT attempted to initiate evaluation. Will follow up as pt available.)  Zenovia Jarred, MSOT, OTR/L [facility] Office Number: [phone] Pager: [phone]  Zenovia Jarred 10/14/2019, 3:13 PM

## 2019-10-15 ENCOUNTER — Encounter (HOSPITAL_COMMUNITY): Payer: Self-pay | Admitting: Interventional Radiology

## 2019-10-15 DIAGNOSIS — I6602 Occlusion and stenosis of left middle cerebral artery: Secondary | ICD-10-CM

## 2019-10-15 LAB — CBC
HCT: 40.8 % (ref 36.0–46.0)
Hemoglobin: 12.7 g/dL (ref 12.0–15.0)
MCH: 28.6 pg (ref 26.0–34.0)
MCHC: 31.1 g/dL (ref 30.0–36.0)
MCV: 91.9 fL (ref 80.0–100.0)
Platelets: 183 10*3/uL (ref 150–400)
RBC: 4.44 MIL/uL (ref 3.87–5.11)
RDW: 14.5 % (ref 11.5–15.5)
WBC: 8.9 10*3/uL (ref 4.0–10.5)
nRBC: 0 % (ref 0.0–0.2)

## 2019-10-15 LAB — BASIC METABOLIC PANEL
Anion gap: 11 (ref 5–15)
BUN: 18 mg/dL (ref 8–23)
CO2: 23 mmol/L (ref 22–32)
Calcium: 8.8 mg/dL — ABNORMAL LOW (ref 8.9–10.3)
Chloride: 105 mmol/L (ref 98–111)
Creatinine, Ser: 1.29 mg/dL — ABNORMAL HIGH (ref 0.44–1.00)
GFR calc Af Amer: 45 mL/min — ABNORMAL LOW (ref >60)
GFR calc non Af Amer: 39 mL/min — ABNORMAL LOW (ref >60)
Glucose, Bld: 86 mg/dL (ref 70–99)
Potassium: 3.7 mmol/L (ref 3.5–5.1)
Sodium: 139 mmol/L (ref 135–145)

## 2019-10-15 LAB — MAGNESIUM: Magnesium: 1.8 mg/dL (ref 1.7–2.4)

## 2019-10-15 MED ORDER — QUETIAPINE FUMARATE 25 MG PO TABS
12.5000 mg | ORAL_TABLET | Freq: Every day | ORAL | Status: DC
  Administered 2019-10-15: 12.5 mg via ORAL
  Filled 2019-10-15: qty 1

## 2019-10-15 MED ORDER — CLOPIDOGREL BISULFATE 75 MG PO TABS
75.0000 mg | ORAL_TABLET | Freq: Every day | ORAL | Status: DC
  Administered 2019-10-15 – 2019-10-16 (×2): 75 mg via ORAL
  Filled 2019-10-15 (×2): qty 1

## 2019-10-15 MED ORDER — NETARSUDIL-LATANOPROST 0.02-0.005 % OP SOLN
1.0000 [drp] | Freq: Every morning | OPHTHALMIC | Status: DC
  Administered 2019-10-16: 1 [drp] via OPHTHALMIC

## 2019-10-15 NOTE — Progress Notes (Addendum)
Physical Therapy Treatment Patient Details Name: Summer [name] MRN: [id number] DOB: Apr 12, 1935 Today's Date: 10/15/2019    History of Present Illness 85 y.o. female with a prior history of mild dementia who presents to the ED as a Code Stroke for assessment of acute onset aphasia. On EMS arrival she followed all commands, was able to walk, but could not speak, except for "Ok" and otherwise garbled speech. CTA demonstrates L M3 branch occlusion of L MCA. PT underwent cerebral angiogram on 7/2 for diagnosis and treatment of occlusion. PMH significant for dementia and HTN.    PT Comments    Pt is up to walk with PT and noted her ataxic and uncoordinated gait, but with HHA is more stable.  Her plan is to continue to assist her with help of HHA, using RW and with cues for sequence and planning.  Her son was not present but nursing stopped by during session to report same issues of HHA being needed.  Focus on balance and endurance with all gait, progress to more independent movement when safely able.   Follow Up Recommendations  Home health PT;Supervision/Assistance - 24 hour     Equipment Recommendations  Rolling walker with 5" wheels    Recommendations for Other Services       Precautions / Restrictions Precautions Precautions: Fall Restrictions Weight Bearing Restrictions: No    Mobility  Bed Mobility Overal bed mobility: Needs Assistance Bed Mobility: Supine to Sit     Supine to sit: Min assist        Transfers Overall transfer level: Needs assistance Equipment used: 1 person hand held assist Transfers: Sit to/from Stand Sit to Stand: Min assist            Ambulation/Gait Ambulation/Gait assistance: Min guard Gait Distance (Feet): 150 Feet Assistive device: None Gait Pattern/deviations: Step-to pattern Gait velocity: reduced Gait velocity interpretation: <1.31 ft/sec, indicative of household ambulator General Gait Details: short steps with ataxic control at  times   Stairs             Wheelchair Mobility    Modified Rankin (Stroke Patients Only)       Balance Overall balance assessment: Needs assistance Sitting-balance support: Feet supported Sitting balance-Leahy Scale: Fair     Standing balance support: Single extremity supported Standing balance-Leahy Scale: Fair Standing balance comment: requires HHA to control ataxic steps                            Cognition Arousal/Alertness: Awake/alert Behavior During Therapy: WFL for tasks assessed/performed Overall Cognitive Status: History of cognitive impairments - at baseline                                 General Comments: confusion and requiring repetitive instructions      Exercises      General Comments General comments (skin integrity, edema, etc.): will have help at home      Pertinent Vitals/Pain Pain Assessment: No/denies pain    Home Living                      Prior Function            PT Goals (current goals can now be found in the care plan section) Acute Rehab PT Goals Patient Stated Goal: To go home Progress towards PT goals: Progressing toward goals    Frequency  Min 4X/week      PT Plan Current plan remains appropriate    Co-evaluation              AM-PAC PT "6 Clicks" Mobility   Outcome Measure  Help needed turning from your back to your side while in a flat bed without using bedrails?: None Help needed moving from lying on your back to sitting on the side of a flat bed without using bedrails?: A Little Help needed moving to and from a bed to a chair (including a wheelchair)?: A Little Help needed standing up from a chair using your arms (e.g., wheelchair or bedside chair)?: A Little Help needed to walk in hospital room?: A Little Help needed climbing 3-5 steps with a railing? : Total 6 Click Score: 17    End of Session Equipment Utilized During Treatment: Gait belt Activity  Tolerance: Patient tolerated treatment well Patient left: with call bell/phone within reach;with nursing/sitter in room;in chair Nurse Communication: Mobility status PT Visit Diagnosis: Other abnormalities of gait and mobility (R26.89);Unsteadiness on feet (R26.81)     Time: 8891-6945 PT Time Calculation (min) (ACUTE ONLY): 20 min  Charges:  $Gait Training: 8-22 mins                  Ramond Dial 10/15/2019, 9:15 PM  Mee Hives, PT MS Acute Rehab Dept. Number: [place]

## 2019-10-15 NOTE — TOC Initial Note (Addendum)
Transition of Care [facility]) - Initial/Assessment Note    Patient Details  Name: Summer [name] MRN: [id number] Date of Birth: 03-19-1935  Transition of [facility] CM/SW Contact:    Pollie Friar, RN Phone Number: 10/15/2019, 12:58 PM  Clinical Narrative:                 CM met with the patient and son at the bedside. Pt slept in chair when CM was discussing d/c plans with son. He states he lives very close and plans on staying with her at d/c at her home: [place] in [place].  [facility] selected for [facility] services and Tommi Rumps with [facility] has accepted the referral.  Son and DIL provide medications for the patient at home and transportation. PCP: Dr Katheran Awe at [facility] following for other d/c needs.  Expected Discharge Plan: [place] Barriers to Discharge: Continued Medical Work up   Patient Goals and CMS Choice   CMS Medicare.gov Compare Post Acute Care list provided to:: Patient Represenative (must comment) Choice offered to / list presented to : Adult Children  Expected Discharge Plan and Services Expected Discharge Plan: [place]   Discharge Planning Services: CM Consult Post Acute Care Choice: Home Health Living arrangements for the past 2 months: Single Family Home                           HH Arranged: PT, OT, Speech Therapy HH Agency: [place] Date [facility] Agency Contacted: 10/15/19   Representative spoke with at [place]: Tommi Rumps  Prior Living Arrangements/Services Living arrangements for the past 2 months: [place] Lives with:: Self Patient language and need for interpreter reviewed:: Yes Do you feel safe going back to the place where you live?: Yes      Need for Family Participation in Patient Care: Yes (Comment) Care giver support system in place?: Yes (comment) (son is to stay with the patient) Current home services: DME (shower seat and 3 in 1) Criminal Activity/Legal Involvement  Pertinent to Current Situation/Hospitalization: No - Comment as needed  Activities of Daily Living Home Assistive Devices/Equipment: Eyeglasses, Hearing aid ADL Screening (condition at time of admission) Patient's cognitive ability adequate to safely complete daily activities?: Yes Is the patient deaf or have difficulty hearing?: Yes Does the patient have difficulty seeing, even when wearing glasses/contacts?: No Does the patient have difficulty concentrating, remembering, or making decisions?: Yes Patient able to express need for assistance with ADLs?: Yes Does the patient have difficulty dressing or bathing?: No Independently performs ADLs?: No Communication: Needs assistance Is this a change from baseline?: Change from baseline, expected to last >3 days Dressing (OT): Independent Grooming: Independent Feeding: Independent Bathing: Independent Toileting: Independent In/Out Bed: Independent Walks in Home: Independent Does the patient have difficulty walking or climbing stairs?: No Weakness of Legs: None Weakness of Arms/Hands: None  Permission Sought/Granted                  Emotional Assessment Appearance:: Appears stated age         Psych Involvement: No (comment)  Admission diagnosis:  Stroke ([place]) [I63.9] Stroke (cerebrum) ([place]) [I63.9] Acute ischemic stroke ([place]) [I63.9] Acute CVA (cerebrovascular accident) ([place]) [I63.9] Middle cerebral artery embolism, left [I66.02] Patient Active Problem List   Diagnosis Date Noted  . Acute CVA (cerebrovascular accident) ([place]) 10/12/2019  . Stroke (cerebrum) ([place]) 10/12/2019  . Middle  cerebral artery embolism, left 10/12/2019   PCP:  No primary care provider on file. Pharmacy:   [facility], [address] [place] [address] Phone: [phone] Fax: [phone]     Social Determinants of Health (SDOH) Interventions    Readmission Risk Interventions No  flowsheet data found.

## 2019-10-15 NOTE — Progress Notes (Signed)
STROKE TEAM PROGRESS NOTE   INTERVAL HISTORY Her son is at the bedside.  She is sitting comfortably in a bedside chair.  He feels that her speech is improving though she still has significant expressive aphasia.  She is pleasant and cooperative.  Vital signs stable.  OBJECTIVE Vitals:   10/14/19 2323 10/15/19 0300 10/15/19 0750 10/15/19 1116  BP: 136/71 133/66 (!) 155/77 131/86  Pulse: 66 60 63 72  Resp: 17 19 16 16   Temp: 98.3 F (36.8 C) (!) 97.5 F (36.4 C) 98.2 F (36.8 C) 97.7 F (36.5 C)  TempSrc: Oral Oral Axillary Oral  SpO2: 96% 96% 100% 100%  Weight:      Height:       CBC:  Recent Labs  Lab 10/12/19 1111 10/12/19 1115 10/14/19 0401 10/15/19 0708  WBC 7.5   < > 7.8 8.9  NEUTROABS 4.5  --   --   --   HGB 12.2   < > 11.5* 12.7  HCT 39.5   < > 37.3 40.8  MCV 92.7   < > 92.1 91.9  PLT 197   < > 183 183   < > = values in this interval not displayed.    Basic Metabolic Panel:  Recent Labs  Lab 10/14/19 0401 10/15/19 0708  NA 141 139  K 3.9 3.7  CL 108 105  CO2 24 23  GLUCOSE 89 86  BUN 15 18  CREATININE 1.24* 1.29*  CALCIUM 8.8* 8.8*    IMAGING past 24h No results found.   PHYSICAL EXAM Pleasant elderly Caucasian lady not in distress. . Afebrile. Head is nontraumatic. Neck is supple without bruit.    Cardiac exam no murmur or gallop. Lungs are clear to auscultation. Distal pulses are well felt. Neurological Exam : Awake alert moderate expressive aphasia and word finding difficulties and nonfluent speech.  Follows commands well.  Slight difficulty with naming and repetition comprehension.  Extraocular movements full range without nystagmus.  Mild right lower facial asymmetry.  Tongue midline.  Motor system exam no drift or focal weakness.  Diminished fine finger movements on the right.  Orbits left over right upper extremity.  Sensation intact.  Gait not tested.  ASSESSMENT/PLAN Ms. MAKINZIE CONSIDINE is a 85 y.o. female with history of mild dementia and  hypertension presenting with acute onset aphasia. She did not receive IV t-PA due to late presentation (>4.5 hours from time of onset). Neuro Interventional Radiology - Cerebral Angiogram with Intervention - Dr Estanislado Pandy - 10/12/19 - 12:24 - no LVO - distal posterior parasylvian M4 branch occlusion - no intervention  Stroke: left MCA territory infarct - embolic - source unknown, suspicious for AF  Resultant aphasia  Code Stroke CT Head -  No acute infarct or hemorrhage. ASPECTS is 10. Stable calcified meningioma total right para clinoid region and left tentorium.   MRI head - Approximately 3 cm infarct in the left inferior frontal gyrus and operculum with no hemorrhage or mass effect. 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  CTA H&N - L M3 branch occlusion of the superior division left MCA.   CT Perfusion - no infarct or penumbra  2D Echo - EF 55 - 60%. No cardiac source of emboli identified.   Sars Corona Virus 2 - negative  Loop placement 7/6 - EP cardiology consulted   LDL - 96  HgbA1c - 5.3  VTE prophylaxis - SCDs  aspirin 81 mg daily prior to admission, now on  aspirin 81 mg  And plavix 75 mg daily x 3 weeks and then plavix alone.    Therapy recommendations:  HH PT, OT, [place] SLP (ordered)  Disposition:  Pending - lives alone PTA, son next door, plans 24h care at d/c at her home  Hypertension  Home BP meds: Zestril  Stable . Permissive hypertension but gradually normalize in 5-7 days  . Resumed home meds  . Long-term BP goal normotensive  Hyperlipidemia  Home Lipid lowering medication: none   LDL - 96, goal < 70  Current lipid lowering medication: add Lipitor 40 mg daily  Continue statin at discharge  Other Stroke Risk Factors  Advanced age  Previous stroke by imaging  Other Active Problems  HOH  Mild dementia -> now on Seroquel Q HS for confusion / agitation in ICU - sleepy into am -> will decrease to 12.5. consider stopping at  d/c CKD - stage 3b - creatinine - 1.46->1.60->1.18->1.24->1.29  Hospital day # 3 She is presented with embolic left hemispheric stroke with resultant aphasia.  Continue aspirin Plavix for 3 weeks followed by plavix alone.  Loop recorder at the time of discharge to look for paroxysmal A. fib.  Continue ongoing therapies.  Long discussion the patient and son at the bedside and answered questions.  Greater than 50% time during this 25-minute visit was spent in counseling and coordination of care about her embolic stroke and answering questions and discussion with care team. Antony Contras, MD To contact Stroke Continuity provider, please refer to [email or web address] After hours, contact General Neurology

## 2019-10-15 NOTE — Progress Notes (Addendum)
°  Speech Language Pathology Treatment: Cognitive-Linquistic (Aphasia/dysarthria)  Patient Details Name: Summer [name] MRN: [id number] DOB: 16-Nov-1934 Today's Date: 10/15/2019 Time: 1638-4536 SLP Time Calculation (min) (ACUTE ONLY): 22 min  Assessment / Plan / Recommendation Clinical Impression  Pt was seen for treatment with her son present. Pt's son reported that he does not know of her having any difficulty with swallowing today but demonstrated coughing with meals on 7/4 when "it seemed like she went too fast". Pt had just eaten and therefore refused p.o. during this session. He reported that the pt's language skills are approximately 50% back to baseline and that she is much improved. She demonstrated 80% accuracy with confrontational naming increasing to 100% with phonemic cues. She completed a picture-description task with 60% accuracy increasing to 100% with verbal prompts. She provided 6 items per category during divergent naming tasks when cues were given She demosntrated 80% accuracy with sentence completion increasing to 100% with phonemic cues. She responded to simple yes/no questions during conversation and structured tasks with 100% accuracy. She demonstrated 60% accuracy with complex yes/no questions increasing to 100% with rephrasing and gestural cues. She was educated regarding compensatory strategies for speech intelligibility but required consistent cueing for use of overarticulation. SLP will continue to follow pt.    HPI HPI: 85 y.o. female with a prior history of mild dementia who presents to the ED as a Code Stroke for assessment of acute onset aphasia. LKN was 63 yesterday, when her son spoke to her on the telephone. Daughter in law visited at [place] today for the "morning check up" and noticed that patient would not speak to her - daughter in law thought that she was just waking up. However, when the phone rang and patient tried to answer it, her speech was garbled. Daughter in law  called 1. On EMS arrival she followed all commands, was able to walk, but could not speak, except for "Ok" and otherwise garbled speech - left side of mouth would move when trying to speak, but right side of mouth would not move. Code Stroke was called en route.       SLP Plan  Continue with current plan of care       Recommendations  Diet recommendations: Dysphagia 3 (mechanical soft);Thin liquid Liquids provided via: Cup;Straw Medication Administration: Whole meds with puree Supervision: Staff to assist with self feeding Compensations: Slow rate;Small sips/bites Postural Changes and/or Swallow Maneuvers: Seated upright 90 degrees                Oral Care Recommendations: Oral care BID Follow up Recommendations: 24 hour supervision/assistance;Home health SLP SLP Visit Diagnosis: Cognitive communication deficit (R41.841);Aphasia (R47.01);Dysarthria and anarthria (R47.1) Plan: Continue with current plan of care       Summer [name] I. Hardin Negus, [place], [place] Office number [phone] Pager [place] 10/15/2019, 5:20 PM   \

## 2019-10-15 NOTE — Evaluation (Signed)
Occupational Therapy Evaluation Patient Details Name: Summer [name] MRN: [id number] DOB: 05/10/1934 Today's Date: 10/15/2019    History of Present Illness 85 y.o. female with a prior history of mild dementia who presents to the ED as a Code Stroke for assessment of acute onset aphasia. On EMS arrival she followed all commands, was able to walk, but could not speak, except for "Ok" and otherwise garbled speech. CTA demonstrates L M3 branch occlusion of L MCA. PT underwent cerebral angiogram on 7/2 for diagnosis and treatment of occlusion. PMH significant for dementia and HTN.   Clinical Impression   PTA pt and family reports pt was independent with ambulation but required assistance with IADLs due to history of dementia. During eval son was present to confirm PLOF and home set up. Pt was admitted for above and treated for problem list below (see OT Problem List). Pt is A&Ox2 disoriented to time and situation. Requires Min Guard-Min A for ADLs with verbal cues for sequencing and safety due to weakness and cognitive deficits. Requires Min Guard with transfers and Min Guard with 1 HHA with ambulation due to weakness and deficits in balance and cognition. Education was administered on activities for incorporating bilateral hand movements to increase coordination and strength- pt/family accepted. Believe pt would benefit from skilled OT services acutely and at the [facility] level to increase safety and independence with ADLs.   Follow Up Recommendations  Home health OT;Supervision/Assistance - 24 hour    Equipment Recommendations  None recommended by OT       Precautions / Restrictions Precautions Precautions: Fall Restrictions Weight Bearing Restrictions: No      Mobility Bed Mobility Overal bed mobility: Needs Assistance Bed Mobility: Supine to Sit     Supine to sit: Min assist     General bed mobility comments: Min A for elevating trunk  Transfers Overall transfer level: Needs  assistance Equipment used: 1 person hand held assist Transfers: Sit to/from Stand Sit to Stand: Min guard         General transfer comment: Min guard with 1 HHA    Balance Overall balance assessment: Needs assistance Sitting-balance support: No upper extremity supported;Feet supported Sitting balance-Leahy Scale: Fair     Standing balance support: No upper extremity supported;During functional activity Standing balance-Leahy Scale: Fair Standing balance comment: reaches out for external supports when ambulating                           ADL either performed or assessed with clinical judgement   ADL Overall ADL's : Needs assistance/impaired     Grooming: Wash/dry hands;Min guard;Cueing for sequencing;Standing Grooming Details (indicate cue type and reason): min guard for safety with cues for sequencing Upper Body Bathing: Min guard;Sitting Upper Body Bathing Details (indicate cue type and reason): Min guard for safety Lower Body Bathing: Minimal assistance;Sitting/lateral leans;Sit to/from stand;Cueing for safety Lower Body Bathing Details (indicate cue type and reason): Min A due to weakness and seated balance Upper Body Dressing : Min guard;Sitting;Cueing for sequencing Upper Body Dressing Details (indicate cue type and reason): Min guard for safety Lower Body Dressing: Minimal assistance;Sitting/lateral leans;Sit to/from stand;Cueing for sequencing Lower Body Dressing Details (indicate cue type and reason): Min A due to sitting balance when leaning over Toilet Transfer: Min guard;Ambulation (1 HHA ) Toilet Transfer Details (indicate cue type and reason): min guard for safety Toileting- Clothing Manipulation and Hygiene: Minimal assistance;Sit to/from stand;Sitting/lateral lean Toileting - Clothing Manipulation Details (indicate cue  type and reason): Min A due to sitting balance Tub/ Shower Transfer: Min guard;Cueing for safety;Cueing for Restaurant manager, fast food Details (indicate cue type and reason): Min guard with cues for safety and cognition Functional mobility during ADLs: Min guard (1HHA) General ADL Comments: Pt min guard for safety with ADLs. Noted decreased cognition and needing verbal cues for cognition and safety. 1 HHA for ambulations with reaching for external supports.      Vision   Vision Assessment?: No apparent visual deficits Additional Comments: no in-depth test due to cognition and following multi-step directions. No noted deficits during functional activity            Pertinent Vitals/Pain Pain Assessment: No/denies pain     Hand Dominance Right   Extremity/Trunk Assessment Upper Extremity Assessment Upper Extremity Assessment: Generalized weakness;LUE deficits/detail LUE Deficits / Details: decreased grip strength on the R side (4/5)   Lower Extremity Assessment Lower Extremity Assessment: Defer to PT evaluation   Cervical / Trunk Assessment Cervical / Trunk Assessment: Normal   Communication Communication Communication: Expressive difficulties;HOH   Cognition Arousal/Alertness: Awake/alert Behavior During Therapy: WFL for tasks assessed/performed Overall Cognitive Status: History of cognitive impairments - at baseline                                 General Comments: Pt with PMH of dementia - A&Ox2 disoriented to time and situation   General Comments  Son present for eval and confirms she will have 24/7 support when d/c            Home Living Family/patient expects to be discharged to:: Private residence Living Arrangements: Alone Available Help at Discharge: Family;Available 24 hours/day Type of Home: House Home Access: Stairs to enter [employer] of Steps: 1 Entrance Stairs-Rails: None Home Layout: One level     Bathroom Shower/Tub: Occupational psychologist: Standard     Home Equipment: Grab bars - toilet;Grab bars - tub/shower   Additional  Comments: Pt son in room and confirmed home set up.  Lives With: Alone;Other (Comment) (family intermittently to assist with meals/meds)    Prior Functioning/Environment Level of Independence: Needs assistance  Gait / Transfers Assistance Needed: independent with mobility ADL's / Homemaking Assistance Needed: pt requires assistance for all IADLs due to  dementia   Comments: not working or driving        OT Problem List: Decreased strength;Impaired balance (sitting and/or standing);Decreased coordination;Decreased cognition;Decreased safety awareness;Decreased knowledge of use of DME or AE;Decreased knowledge of precautions      OT Treatment/Interventions: Self-care/ADL training;Therapeutic exercise;Energy conservation;DME and/or AE instruction;Therapeutic activities;Cognitive remediation/compensation;Patient/family education;Balance training    OT Goals(Current goals can be found in the care plan section) Acute Rehab OT Goals Patient Stated Goal: To go home OT Goal Formulation: With patient/family Time For Goal Achievement: 10/29/19 Potential to Achieve Goals: Good  OT Frequency: Min 2X/week              AM-PAC OT "6 Clicks" Daily Activity     Outcome Measure Help from another person eating meals?: None Help from another person taking care of personal grooming?: A Little Help from another person toileting, which includes using toliet, bedpan, or urinal?: A Little Help from another person bathing (including washing, rinsing, drying)?: A Little Help from another person to put on and taking off regular upper body clothing?: A Little Help from another person to put on and taking off regular  lower body clothing?: A Little 6 Click Score: 19   End of Session Equipment Utilized During Treatment: Gait belt Nurse Communication: Mobility status  Activity Tolerance: Patient tolerated treatment well Patient left: in chair;with chair alarm set;with call bell/phone within reach  OT  Visit Diagnosis: Unsteadiness on feet (R26.81);Muscle weakness (generalized) (M62.81);Other symptoms and signs involving the nervous system (R29.898);Cognitive communication deficit (R41.841)                Time: 8242-3536 OT Time Calculation (min): 20 min Charges:  OT General Charges $OT Visit: 1 Visit OT Evaluation $OT Eval Moderate Complexity: 1 Mod  Carlean Crowl/OTS  Hydee Fleece 10/15/2019, 11:51 AM

## 2019-10-16 ENCOUNTER — Encounter (HOSPITAL_COMMUNITY)
Admission: EM | Disposition: A | Discharge status: Home Health | Payer: Self-pay | Source: Home / Self Care | Attending: Neurology

## 2019-10-16 ENCOUNTER — Encounter (HOSPITAL_COMMUNITY): Payer: Self-pay | Admitting: Internal Medicine

## 2019-10-16 DIAGNOSIS — H9190 Unspecified hearing loss, unspecified ear: Secondary | ICD-10-CM

## 2019-10-16 DIAGNOSIS — E785 Hyperlipidemia, unspecified: Secondary | ICD-10-CM | POA: Diagnosis present

## 2019-10-16 DIAGNOSIS — I10 Essential (primary) hypertension: Secondary | ICD-10-CM | POA: Diagnosis present

## 2019-10-16 DIAGNOSIS — N183 Chronic kidney disease, stage 3 unspecified: Secondary | ICD-10-CM | POA: Diagnosis present

## 2019-10-16 DIAGNOSIS — I63412 Cerebral infarction due to embolism of left middle cerebral artery: Principal | ICD-10-CM

## 2019-10-16 HISTORY — PX: LOOP RECORDER INSERTION: EP1214

## 2019-10-16 SURGERY — LOOP RECORDER INSERTION

## 2019-10-16 MED ORDER — CLOPIDOGREL BISULFATE 75 MG PO TABS: 75.0000 mg | ORAL_TABLET | Freq: Every day | ORAL | 2 refills | Status: DC

## 2019-10-16 MED ORDER — LIDOCAINE-EPINEPHRINE 1 %-1:100000 IJ SOLN
INTRAMUSCULAR | Status: AC
  Filled 2019-10-16: qty 1

## 2019-10-16 MED ORDER — ASPIRIN 81 MG PO CHEW: 81.0000 mg | CHEWABLE_TABLET | Freq: Every day | ORAL | Status: AC

## 2019-10-16 MED ORDER — ATORVASTATIN CALCIUM 40 MG PO TABS: 40.0000 mg | ORAL_TABLET | Freq: Every day | ORAL | 2 refills | Status: DC

## 2019-10-16 MED ORDER — LIDOCAINE-EPINEPHRINE 1 %-1:100000 IJ SOLN
INTRAMUSCULAR | Status: DC | PRN
  Administered 2019-10-16: 5 mL

## 2019-10-16 SURGICAL SUPPLY — 2 items
MONITOR REVEAL LINQ II (Prosthesis & Implant Heart) ×3 IMPLANT
PACK LOOP INSERTION (CUSTOM PROCEDURE TRAY) ×3 IMPLANT

## 2019-10-16 NOTE — Discharge Instructions (Signed)

## 2019-10-16 NOTE — H&P (View-Only) (Signed)
ELECTROPHYSIOLOGY CONSULT NOTE  Patient ID: Summer [name] MRN: [id number], DOB/AGE: 83-Feb-1936   Admit date: 10/12/2019 Date of Consult: 10/16/2019  Primary Physician: No primary care provider on file. Primary Cardiologist: No primary care provider on file.  Primary Electrophysiologist: New to Dr. Rayann Heman Reason for Consultation: Cryptogenic stroke; recommendations regarding Implantable Loop Recorder Insurance: [facility] Medicare  History of Present Illness EP has been asked to evaluate Summer [name] for placement of an implantable loop recorder to monitor for atrial fibrillation by Dr Leonie Man.  The patient was admitted on 10/12/2019 with acute aphasia.  Imaging demonstrated left MCA infarct. No tPA with late presentation.  They have undergone workup for stroke including echocardiogram and CTA Head and Neck. The patient has been monitored on telemetry which has demonstrated sinus rhythm and sinus tachycardia. 1 episode NSVT noted. Inpatient stroke work-up will not require a TEE per Neurology.   Echocardiogram this admission demonstrated EF 55-60%.  Lab work is reviewed.  Discussed with patient and daughter-in-law at bedside. Prior to admission, the patient denies chest pain, shortness of breath, dizziness, palpitations, or syncope.  They are recovering from their stroke with plans to return home with HH/PT  at discharge.  History reviewed. No pertinent past medical history.   Surgical History:  Past Surgical History:  Procedure Laterality Date  . RADIOLOGY WITH ANESTHESIA N/A 10/12/2019   Procedure: IR WITH ANESTHESIA;  Surgeon: Luanne Bras, MD;  Location: [place];  Service: Radiology;  Laterality: N/A;     Medications Prior to Admission  Medication Sig Dispense Refill Last Dose  . aspirin 81 MG chewable tablet Chew 81 mg by mouth daily.   10/11/2019 at Unknown time  . Cholecalciferol 25 MCG (1000 UT) tablet Take 1,000 Units by mouth daily.   10/11/2019 at Unknown time  . citalopram (CELEXA) 20  MG tablet Take 20 mg by mouth daily.   10/11/2019 at Unknown time  . Coenzyme Q10 (COQ10 PO) Take 1 tablet by mouth daily.   10/11/2019 at Unknown time  . donepezil (ARICEPT) 10 MG tablet Take 20 mg by mouth at bedtime.   10/11/2019 at Unknown time  . lisinopril (ZESTRIL) 10 MG tablet Take 10 mg by mouth daily.   10/11/2019 at Unknown time  . ROCKLATAN 0.02-0.005 % SOLN Place 1 drop into both eyes every evening.   10/11/2019 at Unknown time    Inpatient Medications:  . aspirin EC  81 mg Oral Daily  . atorvastatin  40 mg Oral Daily  . citalopram  20 mg Oral Daily  . clopidogrel  75 mg Oral Daily  . donepezil  20 mg Oral QHS  . lisinopril  10 mg Oral Daily  . mouth rinse  15 mL Mouth Rinse BID  . Netarsudil-Latanoprost  1 drop Both Eyes q morning - 10a  . QUEtiapine  12.5 mg Oral QHS    Allergies: No Known Allergies  Social History   Socioeconomic History  . Marital status: Widowed    Spouse name: Not on file  . Number of children: Not on file  . Years of education: Not on file  . Highest education level: Not on file  Occupational History  . Not on file  Tobacco Use  . Smoking status: Never Smoker  . Smokeless tobacco: Never Used  . Tobacco comment: Per son  Vaping Use  . Vaping Use: Never used  Substance and Sexual Activity  . Alcohol use: Never  . Drug use: Never  . Sexual activity: Not on  file  Other Topics Concern  . Not on file  Social History Narrative  . Not on file   Social Determinants of Health   Financial Resource Strain:   . Difficulty of Paying Living Expenses:   Food Insecurity:   . Worried About Charity fundraiser in the Last Year:   . Arboriculturist in the Last Year:   Transportation Needs:   . Film/video editor (Medical):   Marland Kitchen Lack of Transportation (Non-Medical):   Physical Activity:   . Days of Exercise per Week:   . Minutes of Exercise per Session:   Stress:   . Feeling of Stress :   Social Connections:   . Frequency of Communication with  Friends and Family:   . Frequency of Social Gatherings with Friends and Family:   . Attends Religious Services:   . Active Member of Clubs or Organizations:   . Attends Archivist Meetings:   Marland Kitchen Marital Status:   Intimate Partner Violence:   . Fear of Current or Ex-Partner:   . Emotionally Abused:   Marland Kitchen Physically Abused:   . Sexually Abused:      No family history on file.    Review of Systems: All other systems reviewed and are otherwise negative except as noted above.  Physical Exam: Vitals:   10/15/19 1952 10/15/19 2349 10/16/19 0433 10/16/19 0846  BP: 119/60 139/78 129/67 118/72  Pulse: 76 81 71 68  Resp: 17 16 15 20   Temp: 98.6 F (37 C) 98.4 F (36.9 C) 98.7 F (37.1 C) 97.9 F (36.6 C)  TempSrc: Oral Oral Oral Oral  SpO2: 97% 96% 100% 100%  Weight:      Height:        GEN- The patient is well appearing, alert and oriented x 3 today.   Head- normocephalic, atraumatic Eyes-  Sclera clear, conjunctiva pink Ears- hearing intact Oropharynx- clear Neck- supple Lungs- Clear to ausculation bilaterally, normal work of breathing Heart- Regular rate and rhythm, no murmurs, rubs or gallops  GI- soft, NT, ND, + BS Extremities- no clubbing, cyanosis, or edema MS- no significant deformity or atrophy Skin- no rash or lesion Psych- euthymic mood, full affect   Labs:   Lab Results  Component Value Date   WBC 8.9 10/15/2019   HGB 12.7 10/15/2019   HCT 40.8 10/15/2019   MCV 91.9 10/15/2019   PLT 183 10/15/2019    Recent Labs  Lab 10/12/19 1111 10/12/19 1115 10/15/19 0708  NA 139   < > 139  K 4.6   < > 3.7  CL 105   < > 105  CO2 26   < > 23  BUN 23   < > 18  CREATININE 1.46*   < > 1.29*  CALCIUM 9.0   < > 8.8*  PROT 6.7  --   --   BILITOT 0.7  --   --   ALKPHOS 67  --   --   ALT 13  --   --   AST 24  --   --   GLUCOSE 92   < > 86   < > = values in this interval not displayed.     Radiology/Studies: CT Code Stroke CTA Head W/WO  contrast  Result Date: 10/12/2019 CLINICAL DATA:  Acute neuro deficit. Aphasia. Last seen normal 1600 hours yesterday. EXAM: CT ANGIOGRAPHY HEAD AND NECK CT PERFUSION BRAIN TECHNIQUE: Multidetector CT imaging of the head and neck was performed using the  standard protocol during bolus administration of intravenous contrast. Multiplanar CT image reconstructions and MIPs were obtained to evaluate the vascular anatomy. Carotid stenosis measurements (when applicable) are obtained utilizing NASCET criteria, using the distal internal carotid diameter as the denominator. Multiphase CT imaging of the brain was performed following IV bolus contrast injection. Subsequent parametric perfusion maps were calculated using RAPID software. CONTRAST:  152mL OMNIPAQUE IOHEXOL 350 MG/ML SOLN COMPARISON:  CT head 10/12/2019 FINDINGS: CTA NECK FINDINGS Aortic arch: Standard branching. Imaged portion shows no evidence of aneurysm or dissection. No significant stenosis of the major arch vessel origins. Right carotid system: Right carotid artery widely patent without stenosis or dissection. Minimal atherosclerotic disease right carotid bifurcation. Left carotid system: Left carotid widely patent without stenosis or dissection. Minimal atherosclerotic disease at the left carotid bifurcation. Vertebral arteries: Both vertebral arteries widely patent without stenosis or irregularity. Skeleton: Degenerative changes in the cervical spine with spurring at C5-6 and C6-7. Erosive changes in the dens which could be due to rheumatoid arthritis or CPPD. Other neck: Negative for mass or adenopathy in the neck. Upper chest: Apical scarring bilaterally without acute abnormality. Review of the MIP images confirms the above findings CTA HEAD FINDINGS Anterior circulation: Mild atherosclerotic disease in the cavernous carotid bilaterally without stenosis. Anterior and middle cerebral arteries patent bilaterally without stenosis. Branch occlusion of small  left M3 branch involving the superior division consistent with aphasia symptoms. Posterior circulation: Both vertebral arteries patent to the basilar. PICA patent bilaterally. Basilar widely patent. Superior cerebellar and posterior cerebral arteries patent bilaterally. Fetal origin of the posterior cerebral artery bilaterally. Venous sinuses: Normal venous enhancement Anatomic variants: None Review of the MIP images confirms the above findings CT Brain Perfusion Findings: ASPECTS: 10 CBF (<30%) Volume: 38mL Perfusion (Tmax>6.0s) volume: 74mL Mismatch Volume: 42mL Infarction Location:No infarct identified. There is some delayed perfusion in the left middle frontal gyrus and region of the speech area. This shows 11 ml of delayed perfusion on T-max greater than 4 seconds but 0 mL on the T-max greater than 6 seconds. IMPRESSION: 1. Positive for left M3 branch occlusion of the superior division left MCA 2. No significant carotid or vertebral artery stenosis in the neck. 3. CT perfusion negative for acute infarct or ischemia using the standard parameters. However on T-max greater than 4 seconds there is 11 mm of delayed perfusion in the area of the speech center. 4. These results were called by telephone at the time of interpretation on 10/12/2019 at 11:53 am to provider ERIC [facility] , who verbally acknowledged these results. Electronically Signed   By: Franchot Gallo M.D.   On: 10/12/2019 11:54   CT Code Stroke CTA Neck W/WO contrast  Result Date: 10/12/2019 CLINICAL DATA:  Acute neuro deficit. Aphasia. Last seen normal 1600 hours yesterday. EXAM: CT ANGIOGRAPHY HEAD AND NECK CT PERFUSION BRAIN TECHNIQUE: Multidetector CT imaging of the head and neck was performed using the standard protocol during bolus administration of intravenous contrast. Multiplanar CT image reconstructions and MIPs were obtained to evaluate the vascular anatomy. Carotid stenosis measurements (when applicable) are obtained utilizing NASCET criteria,  using the distal internal carotid diameter as the denominator. Multiphase CT imaging of the brain was performed following IV bolus contrast injection. Subsequent parametric perfusion maps were calculated using RAPID software. CONTRAST:  130mL OMNIPAQUE IOHEXOL 350 MG/ML SOLN COMPARISON:  CT head 10/12/2019 FINDINGS: CTA NECK FINDINGS Aortic arch: Standard branching. Imaged portion shows no evidence of aneurysm or dissection. No significant stenosis of the major arch  vessel origins. Right carotid system: Right carotid artery widely patent without stenosis or dissection. Minimal atherosclerotic disease right carotid bifurcation. Left carotid system: Left carotid widely patent without stenosis or dissection. Minimal atherosclerotic disease at the left carotid bifurcation. Vertebral arteries: Both vertebral arteries widely patent without stenosis or irregularity. Skeleton: Degenerative changes in the cervical spine with spurring at C5-6 and C6-7. Erosive changes in the dens which could be due to rheumatoid arthritis or CPPD. Other neck: Negative for mass or adenopathy in the neck. Upper chest: Apical scarring bilaterally without acute abnormality. Review of the MIP images confirms the above findings CTA HEAD FINDINGS Anterior circulation: Mild atherosclerotic disease in the cavernous carotid bilaterally without stenosis. Anterior and middle cerebral arteries patent bilaterally without stenosis. Branch occlusion of small left M3 branch involving the superior division consistent with aphasia symptoms. Posterior circulation: Both vertebral arteries patent to the basilar. PICA patent bilaterally. Basilar widely patent. Superior cerebellar and posterior cerebral arteries patent bilaterally. Fetal origin of the posterior cerebral artery bilaterally. Venous sinuses: Normal venous enhancement Anatomic variants: None Review of the MIP images confirms the above findings CT Brain Perfusion Findings: ASPECTS: 10 CBF (<30%) Volume:  72mL Perfusion (Tmax>6.0s) volume: 42mL Mismatch Volume: 63mL Infarction Location:No infarct identified. There is some delayed perfusion in the left middle frontal gyrus and region of the speech area. This shows 11 ml of delayed perfusion on T-max greater than 4 seconds but 0 mL on the T-max greater than 6 seconds. IMPRESSION: 1. Positive for left M3 branch occlusion of the superior division left MCA 2. No significant carotid or vertebral artery stenosis in the neck. 3. CT perfusion negative for acute infarct or ischemia using the standard parameters. However on T-max greater than 4 seconds there is 11 mm of delayed perfusion in the area of the speech center. 4. These results were called by telephone at the time of interpretation on 10/12/2019 at 11:53 am to provider ERIC [facility] , who verbally acknowledged these results. Electronically Signed   By: Franchot Gallo M.D.   On: 10/12/2019 11:54   MR BRAIN WO CONTRAST  Result Date: 10/13/2019 CLINICAL DATA:  85 year old female code stroke presentation yesterday with distal left MCA branch occlusion, not treated endovascularly. No IV tPA given (outside of time window). EXAM: MRI HEAD WITHOUT CONTRAST TECHNIQUE: Multiplanar, multiecho pulse sequences of the brain and surrounding structures were obtained without intravenous contrast. COMPARISON:  CT head and CTA CTP yesterday. [facility] Brain MRI 09/27/2016. FINDINGS: Study is intermittently degraded by motion artifact despite repeated imaging attempts. Brain: 3 cm area of restricted diffusion in the left inferior frontal gyrus and left operculum (series 7, image 53). This is fairly consistent with the T-max greater than 4 seconds abnormality on CTP yesterday. Minimal additional subcortical white matter restricted diffusion in the left frontal lobe. No contralateral or posterior fossa restricted diffusion. T2 and FLAIR hyperintensity in the affected parenchyma with no associated hemorrhage or mass effect. Chronic  right supraclinoid meningioma measuring roughly 22 mm diameter appears not significantly changed in size since 2018. Chronic mass effect on the right inferior frontal gyrus, and now there does appear to be mild associated regional cerebral edema on FLAIR series 11, image 12-new since 2018. Stable much smaller left posterior tentorium meningioma on series 10, image 10, measuring a about 9 mm. No other intracranial mass or mass effect. There is evidence of a small new but chronic infarct in the left cerebellum on series 10, image 7. Stable mild T2 heterogeneity in the  left thalamus. No other new signal abnormality. No ventriculomegaly, extra-axial fluid collection or acute intracranial hemorrhage. Cervicomedullary junction and pituitary are within normal limits. Vascular: Major intracranial vascular flow voids are stable since 2018. Skull and upper cervical spine: Stable and negative for age visible cervical spine. Bone marrow signal remains within normal limits. Sinuses/Orbits: Stable, negative. Other: Mastoids remain clear. Visible internal auditory structures appear normal. Scalp and face soft tissues appear negative. IMPRESSION: 1. Approximately 3 cm infarct in the left inferior frontal gyrus and operculum with no hemorrhage or mass effect. This closely resembles the T-max > 4s abnormality on CTP yesterday. 2. Chronic 2.2 cm right supraclinoid meningioma. Evidence of mild new cerebral edema in the adjacent right inferior frontal gyrus since a 2018 MRI. No increased mass effect. Stable much smaller chronic left tentorium meningioma. 3. 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 Electronically Signed   By: Genevie Ann M.D.   On: 10/13/2019 11:34   CT Code Stroke Cerebral Perfusion with contrast  Result Date: 10/12/2019 CLINICAL DATA:  Acute neuro deficit. Aphasia. Last seen normal 1600 hours yesterday. EXAM: CT ANGIOGRAPHY HEAD AND NECK CT PERFUSION BRAIN TECHNIQUE: Multidetector CT imaging of  the head and neck was performed using the standard protocol during bolus administration of intravenous contrast. Multiplanar CT image reconstructions and MIPs were obtained to evaluate the vascular anatomy. Carotid stenosis measurements (when applicable) are obtained utilizing NASCET criteria, using the distal internal carotid diameter as the denominator. Multiphase CT imaging of the brain was performed following IV bolus contrast injection. Subsequent parametric perfusion maps were calculated using RAPID software. CONTRAST:  128mL OMNIPAQUE IOHEXOL 350 MG/ML SOLN COMPARISON:  CT head 10/12/2019 FINDINGS: CTA NECK FINDINGS Aortic arch: Standard branching. Imaged portion shows no evidence of aneurysm or dissection. No significant stenosis of the major arch vessel origins. Right carotid system: Right carotid artery widely patent without stenosis or dissection. Minimal atherosclerotic disease right carotid bifurcation. Left carotid system: Left carotid widely patent without stenosis or dissection. Minimal atherosclerotic disease at the left carotid bifurcation. Vertebral arteries: Both vertebral arteries widely patent without stenosis or irregularity. Skeleton: Degenerative changes in the cervical spine with spurring at C5-6 and C6-7. Erosive changes in the dens which could be due to rheumatoid arthritis or CPPD. Other neck: Negative for mass or adenopathy in the neck. Upper chest: Apical scarring bilaterally without acute abnormality. Review of the MIP images confirms the above findings CTA HEAD FINDINGS Anterior circulation: Mild atherosclerotic disease in the cavernous carotid bilaterally without stenosis. Anterior and middle cerebral arteries patent bilaterally without stenosis. Branch occlusion of small left M3 branch involving the superior division consistent with aphasia symptoms. Posterior circulation: Both vertebral arteries patent to the basilar. PICA patent bilaterally. Basilar widely patent. Superior  cerebellar and posterior cerebral arteries patent bilaterally. Fetal origin of the posterior cerebral artery bilaterally. Venous sinuses: Normal venous enhancement Anatomic variants: None Review of the MIP images confirms the above findings CT Brain Perfusion Findings: ASPECTS: 10 CBF (<30%) Volume: 68mL Perfusion (Tmax>6.0s) volume: 53mL Mismatch Volume: 14mL Infarction Location:No infarct identified. There is some delayed perfusion in the left middle frontal gyrus and region of the speech area. This shows 11 ml of delayed perfusion on T-max greater than 4 seconds but 0 mL on the T-max greater than 6 seconds. IMPRESSION: 1. Positive for left M3 branch occlusion of the superior division left MCA 2. No significant carotid or vertebral artery stenosis in the neck. 3. CT perfusion negative for acute infarct  or ischemia using the standard parameters. However on T-max greater than 4 seconds there is 11 mm of delayed perfusion in the area of the speech center. 4. These results were called by telephone at the time of interpretation on 10/12/2019 at 11:53 am to provider ERIC [facility] , who verbally acknowledged these results. Electronically Signed   By: Franchot Gallo M.D.   On: 10/12/2019 11:54   ECHOCARDIOGRAM COMPLETE  Result Date: 10/13/2019    ECHOCARDIOGRAM REPORT   Patient Name:   AVY BARLETT Date of Exam: 10/13/2019 Medical Rec #:  [id number]    Height:       68.0 in Accession #:    [id number]   Weight:       141.3 lb Date of Birth:  06/18/1934   BSA:          1.763 m Patient Age:    81 years     BP:           142/68 mmHg Patient Gender: F            HR:           73 bpm. Exam Location:  Inpatient Procedure: 2D Echo, Cardiac Doppler and Color Doppler Indications:    CVA  History:        Patient has no prior history of Echocardiogram examinations.                 Stroke, Signs/Symptoms:Altered Mental Status; Risk                 Factors:Hypertension.  Sonographer:    Dustin Flock Referring Phys: [place]  1. Left ventricular ejection fraction, by estimation, is 55 to 60%. The left ventricle has normal function. The left ventricle has no regional wall motion abnormalities. Left ventricular diastolic parameters are consistent with Grade I diastolic dysfunction (impaired relaxation). Elevated left atrial pressure.  2. Right ventricular systolic function is normal. The right ventricular size is normal. There is mildly elevated pulmonary artery systolic pressure.  3. The mitral valve is normal in structure. Mild to moderate mitral valve regurgitation. No evidence of mitral stenosis.  4. The aortic valve is tricuspid. Aortic valve regurgitation is not visualized. Mild aortic valve sclerosis is present, with no evidence of aortic valve stenosis.  5. The inferior vena cava is normal in size with greater than 50% respiratory variability, suggesting right atrial pressure of 3 mmHg. FINDINGS  Left Ventricle: Left ventricular ejection fraction, by estimation, is 55 to 60%. The left ventricle has normal function. The left ventricle has no regional wall motion abnormalities. The left ventricular internal cavity size was normal in size. There is  no left ventricular hypertrophy. Left ventricular diastolic parameters are consistent with Grade I diastolic dysfunction (impaired relaxation). Elevated left atrial pressure. Right Ventricle: The right ventricular size is normal. Right ventricular systolic function is normal. There is mildly elevated pulmonary artery systolic pressure. The tricuspid regurgitant velocity is 3.16 m/s, and with an assumed right atrial pressure of 3 mmHg, the estimated right ventricular systolic pressure is 40.9 mmHg. Left Atrium: Left atrial size was normal in size. Right Atrium: Right atrial size was normal in size. Pericardium: There is no evidence of pericardial effusion. Mitral Valve: The mitral valve is normal in structure. Normal mobility of the mitral valve leaflets. Mild to moderate  mitral valve regurgitation. No evidence of mitral valve stenosis. Tricuspid Valve: The tricuspid valve is normal in structure. Tricuspid valve regurgitation is mild .  No evidence of tricuspid stenosis. Aortic Valve: The aortic valve is tricuspid. Aortic valve regurgitation is not visualized. Mild aortic valve sclerosis is present, with no evidence of aortic valve stenosis. Pulmonic Valve: The pulmonic valve was not well visualized. Pulmonic valve regurgitation is not visualized. No evidence of pulmonic stenosis. Aorta: The aortic root is normal in size and structure. Venous: The inferior vena cava is normal in size with greater than 50% respiratory variability, suggesting right atrial pressure of 3 mmHg.  LEFT VENTRICLE PLAX 2D LVIDd:         4.50 cm  Diastology LVIDs:         3.40 cm  LV e' lateral:   7.72 cm/s LV PW:         1.10 cm  LV E/e' lateral: 15.0 LV IVS:        1.00 cm  LV e' medial:    5.00 cm/s LVOT diam:     1.90 cm  LV E/e' medial:  23.2 LV SV:         62 LV SV Index:   35 LVOT Area:     2.84 cm  RIGHT VENTRICLE RV Basal diam:  3.00 cm RV S prime:     13.60 cm/s TAPSE (M-mode): 2.6 cm LEFT ATRIUM             Index       RIGHT ATRIUM           Index LA diam:        3.00 cm 1.70 cm/m  RA Area:     13.20 cm LA Vol (A2C):   35.3 ml 20.02 ml/m RA Volume:   32.10 ml  18.20 ml/m LA Vol (A4C):   32.8 ml 18.60 ml/m LA Biplane Vol: 37.5 ml 21.27 ml/m  AORTIC VALVE LVOT Vmax:   94.20 cm/s LVOT Vmean:  61.500 cm/s LVOT VTI:    0.220 m  AORTA Ao Root diam: 2.50 cm MITRAL VALVE                TRICUSPID VALVE MV Area (PHT): 4.21 cm     TR Peak grad:   39.9 mmHg MV Decel Time: 180 msec     TR Vmax:        316.00 cm/s MV E velocity: 116.00 cm/s MV A velocity: 107.00 cm/s  SHUNTS MV E/A ratio:  1.08         Systemic VTI:  0.22 m                             Systemic Diam: 1.90 cm Kirk Ruths MD Electronically signed by Kirk Ruths MD Signature Date/Time: 10/13/2019/2:44:57 PM    Final    CT HEAD CODE  STROKE WO CONTRAST  Result Date: 10/12/2019 CLINICAL DATA:  Code stroke. Focal neuro deficit greater than 6 hours. Rule out stroke. EXAM: CT HEAD WITHOUT CONTRAST TECHNIQUE: Contiguous axial images were obtained from the base of the skull through the vertex without intravenous contrast. COMPARISON:  CT head 07/14/2017.  MRI head 09/27/2016 FINDINGS: Brain: Negative for acute infarct or hemorrhage. Generalized atrophy without hydrocephalus. Densely calcified right para clinoid mass measures 2.2 x 2.1 cm and is unchanged from the prior study. Second 9 mm calcified meningioma below the left tentorium also unchanged. No significant cerebral edema. Vascular: Negative for hyperdense vessel Skull: Negative Sinuses/Orbits: Paranasal sinuses clear.  Negative orbit. Other: None ASPECTS [facility] Stroke Program Early CT Score) -  Ganglionic level infarction (caudate, lentiform nuclei, internal capsule, insula, M1-M3 cortex): 7 - Supraganglionic infarction (M4-M6 cortex): 3 Total score (0-10 with 10 being normal): 10 IMPRESSION: 1. No acute infarct or hemorrhage 2. ASPECTS is 10 3. Stable calcified meningioma total right para clinoid region and left tentorium. 4. Code stroke imaging results were communicated on 10/12/2019 at 11:24 am to provider Dr. Cheral Marker via [facility] Electronically Signed   By: Franchot Gallo M.D.   On: 10/12/2019 11:24   12-lead ECG NSR at 78 bpm, narrow QRS (personally reviewed) All prior EKG's in EPIC reviewed with no documented atrial fibrillation  Telemetry NSR, 1 episodes NSVT noted. Sinus tachycardia noted with PT (personally reviewed)  Assessment and Plan:  1. Cryptogenic stroke The patient presents with cryptogenic stroke.  The patient does not have a TEE planned for this AM.  I spoke at length with the patient about monitoring for afib with an implantable loop recorder.  Risks, benefits, and alteratives to implantable loop recorder were discussed with the patient today.   At this time, the  patient is very clear in their decision to proceed with implantable loop recorder.   Wound care was reviewed with the patient (keep incision clean and dry for 3 days).  Wound check scheduled and entered in AVS. Please call with questions.   Shirley Friar, PA-C 10/16/2019 9:27 AM  I have seen, examined the patient, and reviewed the above assessment and plan.  Changes to above are made where necessary.  On exam, RRR.  The patient has had a stroke of unknown cause.  I agree with Dr Leonie Man that long term monitoring is appropriate.  Risks and benefits to loop recorder placement were discussed with the patient who wishes to proceed.  Co Sign: Thompson Grayer, MD 10/16/2019 1:29 PM

## 2019-10-16 NOTE — Progress Notes (Signed)
PT Cancellation Note  Patient Details Name: Summer [name] MRN: [id number] DOB: 1934/12/02   Cancelled Treatment:    Reason Eval/Treat Not Completed: Medical issues which prohibited therapy;Patient at procedure or test/unavailable  Initially checked on pt at 10:40 am and RN asked PT to hold and see in PM due to episode of tachycardia during OT session earlier in the morning. Upon check back at 1:50 pm pt unavailable, in cath lab. Will continue to follow and check back as schedule allows to continue with PT POC.    Thelma Comp 10/16/2019, 1:51 PM   Rolinda Roan, PT, DPT Acute Rehabilitation Services Pager: [phone] Office: [phone]

## 2019-10-16 NOTE — Interval H&P Note (Signed)
History and Physical Interval Note:  10/16/2019 1:36 PM  Summer [name]  has presented today for surgery, with the diagnosis of stroke.  The various methods of treatment have been discussed with the patient and family. After consideration of risks, benefits and other options for treatment, the patient has consented to  Procedure(s): LOOP RECORDER INSERTION (N/A) as a surgical intervention.  The patient's history has been reviewed, patient examined, no change in status, stable for surgery.  I have reviewed the patient's chart and labs.  Questions were answered to the patient's satisfaction.     Thompson Grayer

## 2019-10-16 NOTE — Progress Notes (Signed)
Occupational Therapy Treatment Patient Details Name: Summer [name] MRN: [id number] DOB: 12-29-1934 Today's Date: 10/16/2019    History of present illness 85 y.o. female with a prior history of mild dementia who presents to the ED as a Code Stroke for assessment of acute onset aphasia. On EMS arrival she followed all commands, was able to walk, but could not speak, except for "Ok" and otherwise garbled speech. CTA demonstrates L M3 branch occlusion of L MCA. PT underwent cerebral angiogram on 7/2 for diagnosis and treatment of occlusion. PMH significant for dementia and HTN.   OT comments  Pt received in be with her daughter-in-law present. Pt required minA for toilet transfer and minguard to minA for grooming at sink level. Pt demonstrated decreased activity tolerance. HR 120s after ambulation to bathroom, up to 155bpm with grooming at sink level, returned to 108bpm after 5 min return to sitting, RN aware. Educated pt and daughter in law on importance of energy conservation strategies and activity progression. Pt will continue to benefit from skilled OT services to maximize safety and independence with ADL/IADL and functional mobility. Will continue to follow acutely and progress as tolerated.    Follow Up Recommendations  Home health OT;Supervision/Assistance - 24 hour    Equipment Recommendations  Other (comment) (Pt may benefit from RW)    Recommendations for Other Services      Precautions / Restrictions Precautions Precautions: Fall Restrictions Weight Bearing Restrictions: No       Mobility Bed Mobility Overal bed mobility: Needs Assistance Bed Mobility: Supine to Sit     Supine to sit: Supervision;HOB elevated     General bed mobility comments: supervision for safety  Transfers Overall transfer level: Needs assistance Equipment used: Rolling walker (2 wheeled) Transfers: Sit to/from Stand Sit to Stand: Min assist         General transfer comment: minA to stand  from regular height commode    Balance Overall balance assessment: Needs assistance Sitting-balance support: Feet supported Sitting balance-Leahy Scale: Fair     Standing balance support: No upper extremity supported;During functional activity Standing balance-Leahy Scale: Fair Standing balance comment: minA for stability with no UE support                           ADL either performed or assessed with clinical judgement   ADL Overall ADL's : Needs assistance/impaired     Grooming: Wash/dry hands;Min guard;Cueing for sequencing;Standing;Oral care Grooming Details (indicate cue type and reason): standing at sink level                 Toilet Transfer: Min guard;Ambulation;RW Toilet Transfer Details (indicate cue type and reason): minA for safety and safe use of RW Toileting- Clothing Manipulation and Hygiene: Minimal assistance;Sit to/from stand Toileting - Clothing Manipulation Details (indicate cue type and reason): minA to stand from regular height toilet     Functional mobility during ADLs: Minimal assistance;Rolling walker General ADL Comments: minguard to minA for safety with functional mobility at [facility] level;HR 120s after ambulated to commode, up to 155bpm with grooming at sink level, (pt asymptomatic) returned to 108bpm after 68min rest break, RN aware;educated daughter in law about energy conservation strategies and activity progression and importance of monitoring pt for symptoms     Vision   Vision Assessment?: No apparent visual deficits Additional Comments: during functional activity   Perception     Praxis      Cognition Arousal/Alertness: Awake/alert Behavior During Therapy:  WFL for tasks assessed/performed Overall Cognitive Status: History of cognitive impairments - at baseline                                 General Comments: pt with hx of dementia, daughter in law reports baseline        Exercises  Provided pt with fine  motor coordination activities HEP and soft theraputty, educated pt's daughter in law on assisting pt with HEP    Shoulder Instructions       General Comments daughter in law present during session    Pertinent Vitals/ Pain       Pain Assessment: No/denies pain Faces Pain Scale: No hurt  Home Living                                          Prior Functioning/Environment              Frequency  Min 2X/week        Progress Toward Goals  OT Goals(current goals can now be found in the care plan section)  Progress towards OT goals: Progressing toward goals  Acute Rehab OT Goals Patient Stated Goal: To go home OT Goal Formulation: With patient/family Time For Goal Achievement: 10/29/19 Potential to Achieve Goals: Good ADL Goals Pt Will Perform Grooming: with supervision;standing Pt Will Perform Lower Body Bathing: with supervision;sit to/from stand Pt Will Perform Lower Body Dressing: with supervision;sit to/from stand Pt Will Transfer to Toilet: with supervision;ambulating;regular height toilet;grab bars Pt/caregiver will Perform Home Exercise Program: Both right and left upper extremity;With theraband;With theraputty;With Supervision;With written HEP provided Additional ADL Goal #1: Pt will complete bed mobility at the supervision level as precursor to OOB activities  Plan Discharge plan remains appropriate    Co-evaluation                 AM-PAC OT "6 Clicks" Daily Activity     Outcome Measure   Help from another person eating meals?: None Help from another person taking care of personal grooming?: A Little Help from another person toileting, which includes using toliet, bedpan, or urinal?: A Little Help from another person bathing (including washing, rinsing, drying)?: A Little Help from another person to put on and taking off regular upper body clothing?: A Little Help from another person to put on and taking off regular lower body  clothing?: A Little 6 Click Score: 19    End of Session Equipment Utilized During Treatment: Gait belt  OT Visit Diagnosis: Unsteadiness on feet (R26.81);Muscle weakness (generalized) (M62.81);Other symptoms and signs involving the nervous system (R29.898);Cognitive communication deficit (R41.841)   Activity Tolerance Patient tolerated treatment well   Patient Left in chair;with chair alarm set;with call bell/phone within reach   Nurse Communication Mobility status        Time: 6599-3570 OT Time Calculation (min): 24 min  Charges: OT General Charges $OT Visit: 1 Visit OT Treatments $Self Care/Home Management : 23-37 mins  Helene Kelp OTR/L Acute Rehabilitation Services Office: [place] 10/16/2019, 11:15 AM

## 2019-10-16 NOTE — Progress Notes (Signed)
SLP Cancellation Note  Patient Details Name: RAILYN HOUSE MRN: [id number] DOB: Jul 13, 1934   Cancelled treatment:  Pt unavailable for procedure/loop recorder placement. SLP will continue efforts.  Braylei Totino L. Tivis Ringer, [place] CCC/SLP Acute Rehabilitation Services Office number [phone] Pager [phone]                                                                                                       Juan Quam Laurice 10/16/2019, 1:51 PM

## 2019-10-16 NOTE — Discharge Summary (Addendum)
Stroke Discharge Summary  Patient ID: Summer [name]   MRN: [id number]      DOB: November 28, 1934  Date of Admission: 10/12/2019 Date of Discharge: 10/16/2019  Attending Physician:  Garvin Fila, MD, Stroke MD Consultant(s):     Wyman Songster, MD (Interventional Neuroradiologist), Thompson Grayer, MD (electrophysiology)  Patient's PCP:  No primary care provider on file.  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 Take with plavix for three weeks, then stop aspirin and continue plavix What changed: additional instructions   atorvastatin 40 MG tablet Commonly known as: LIPITOR Take 1 tablet (40 mg total) by mouth daily. Start taking on: October 17, 2019   Cholecalciferol 25 MCG (1000 UT) tablet Take 1,000 Units by mouth daily.   citalopram 20 MG tablet Commonly known as: CELEXA Take 20 mg by mouth daily.   clopidogrel 75 MG tablet Commonly known as: PLAVIX Take 1 tablet (75 mg total) by mouth daily. Start taking on: October 17, 2019   COQ10 PO Take 1 tablet by mouth daily.   donepezil 10 MG tablet Commonly known as: ARICEPT Take 20 mg by mouth at bedtime.   lisinopril 10 MG tablet Commonly known as: ZESTRIL Take 10 mg by mouth daily.   Rocklatan 0.02-0.005 % Soln Generic drug: Netarsudil-Latanoprost Place 1 drop into both eyes every evening.               Durable Medical Equipment  (From admission, onward)           Start     Ordered   10/16/19 1225  For home use only DME Walker rolling  Once       Question  Answer Comment  Walker: With 5 Inch Wheels   Patient needs a walker to treat with the following condition Stroke ([place])      10/16/19 1225            LABORATORY STUDIES CBC    Component Value Date/Time   WBC 8.9 10/15/2019 0708   RBC 4.44 10/15/2019 0708   HGB 12.7 10/15/2019 0708   HCT 40.8 10/15/2019 0708   PLT 183 10/15/2019 0708   MCV 91.9 10/15/2019 0708   MCH 28.6 10/15/2019 0708   MCHC 31.1 10/15/2019 0708   RDW 14.5 10/15/2019 0708   LYMPHSABS 2.0 10/12/2019 1111   MONOABS 0.7 10/12/2019 1111   EOSABS 0.2 10/12/2019 1111   BASOSABS 0.1 10/12/2019 1111   CMP    Component Value Date/Time   NA 139 10/15/2019 0708   K 3.7 10/15/2019 0708   CL 105 10/15/2019 0708   CO2 23 10/15/2019 0708   GLUCOSE 86 10/15/2019 0708   BUN 18 10/15/2019 0708   CREATININE 1.29 (H) 10/15/2019 0708   CALCIUM 8.8 (L) 10/15/2019 0708   PROT 6.7 10/12/2019 1111   ALBUMIN 3.7 10/12/2019 1111   AST 24 10/12/2019 1111   ALT 13 10/12/2019 1111   ALKPHOS 67 10/12/2019 1111   BILITOT  0.7 10/12/2019 1111   GFRNONAA 39 (L) 10/15/2019 0708   GFRAA 45 (L) 10/15/2019 0708   COAGS Lab Results  Component Value Date   INR 1.0 10/12/2019   Lipid Panel    Component Value Date/Time   CHOL 158 10/13/2019 0445   TRIG 63 10/13/2019 0445   HDL 49 10/13/2019 0445   CHOLHDL 3.2 10/13/2019 0445   VLDL 13 10/13/2019 0445   LDLCALC 96 10/13/2019 0445   HgbA1C  Lab Results  Component Value Date   HGBA1C 5.3 10/13/2019    SIGNIFICANT DIAGNOSTIC STUDIES CT Code Stroke CTA Head W/WO contrast  Result Date: 10/12/2019 CLINICAL DATA:  Acute neuro deficit. Aphasia. Last seen normal 1600 hours yesterday. EXAM: CT ANGIOGRAPHY HEAD AND NECK CT PERFUSION BRAIN TECHNIQUE: Multidetector CT imaging of the head and neck was performed using the standard protocol during bolus administration of intravenous contrast. Multiplanar CT image reconstructions and MIPs were obtained to evaluate the vascular anatomy.  Carotid stenosis measurements (when applicable) are obtained utilizing NASCET criteria, using the distal internal carotid diameter as the denominator. Multiphase CT imaging of the brain was performed following IV bolus contrast injection. Subsequent parametric perfusion maps were calculated using RAPID software. CONTRAST:  15mL OMNIPAQUE IOHEXOL 350 MG/ML SOLN COMPARISON:  CT head 10/12/2019 FINDINGS: CTA NECK FINDINGS Aortic arch: Standard branching. Imaged portion shows no evidence of aneurysm or dissection. No significant stenosis of the major arch vessel origins. Right carotid system: Right carotid artery widely patent without stenosis or dissection. Minimal atherosclerotic disease right carotid bifurcation. Left carotid system: Left carotid widely patent without stenosis or dissection. Minimal atherosclerotic disease at the left carotid bifurcation. Vertebral arteries: Both vertebral arteries widely patent without stenosis or irregularity. Skeleton: Degenerative changes in the cervical spine with spurring at C5-6 and C6-7. Erosive changes in the dens which could be due to rheumatoid arthritis or CPPD. Other neck: Negative for mass or adenopathy in the neck. Upper chest: Apical scarring bilaterally without acute abnormality. Review of the MIP images confirms the above findings CTA HEAD FINDINGS Anterior circulation: Mild atherosclerotic disease in the cavernous carotid bilaterally without stenosis. Anterior and middle cerebral arteries patent bilaterally without stenosis. Branch occlusion of small left M3 branch involving the superior division consistent with aphasia symptoms. Posterior circulation: Both vertebral arteries patent to the basilar. PICA patent bilaterally. Basilar widely patent. Superior cerebellar and posterior cerebral arteries patent bilaterally. Fetal origin of the posterior cerebral artery bilaterally. Venous sinuses: Normal venous enhancement Anatomic variants: None Review of the MIP images  confirms the above findings CT Brain Perfusion Findings: ASPECTS: 10 CBF (<30%) Volume: 37mL Perfusion (Tmax>6.0s) volume: 18mL Mismatch Volume: 36mL Infarction Location:No infarct identified. There is some delayed perfusion in the left middle frontal gyrus and region of the speech area. This shows 11 ml of delayed perfusion on T-max greater than 4 seconds but 0 mL on the T-max greater than 6 seconds. IMPRESSION: 1. Positive for left M3 branch occlusion of the superior division left MCA 2. No significant carotid or vertebral artery stenosis in the neck. 3. CT perfusion negative for acute infarct or ischemia using the standard parameters. However on T-max greater than 4 seconds there is 11 mm of delayed perfusion in the area of the speech center. 4. These results were called by telephone at the time of interpretation on 10/12/2019 at 11:53 am to provider ERIC [facility] , who verbally acknowledged these results. Electronically Signed   By: Franchot Gallo M.D.   On: 10/12/2019 11:54   CT  Code Stroke CTA Neck W/WO contrast  Result Date: 10/12/2019 CLINICAL DATA:  Acute neuro deficit. Aphasia. Last seen normal 1600 hours yesterday. EXAM: CT ANGIOGRAPHY HEAD AND NECK CT PERFUSION BRAIN TECHNIQUE: Multidetector CT imaging of the head and neck was performed using the standard protocol during bolus administration of intravenous contrast. Multiplanar CT image reconstructions and MIPs were obtained to evaluate the vascular anatomy. Carotid stenosis measurements (when applicable) are obtained utilizing NASCET criteria, using the distal internal carotid diameter as the denominator. Multiphase CT imaging of the brain was performed following IV bolus contrast injection. Subsequent parametric perfusion maps were calculated using RAPID software. CONTRAST:  156mL OMNIPAQUE IOHEXOL 350 MG/ML SOLN COMPARISON:  CT head 10/12/2019 FINDINGS: CTA NECK FINDINGS Aortic arch: Standard branching. Imaged portion shows no evidence of aneurysm or  dissection. No significant stenosis of the major arch vessel origins. Right carotid system: Right carotid artery widely patent without stenosis or dissection. Minimal atherosclerotic disease right carotid bifurcation. Left carotid system: Left carotid widely patent without stenosis or dissection. Minimal atherosclerotic disease at the left carotid bifurcation. Vertebral arteries: Both vertebral arteries widely patent without stenosis or irregularity. Skeleton: Degenerative changes in the cervical spine with spurring at C5-6 and C6-7. Erosive changes in the dens which could be due to rheumatoid arthritis or CPPD. Other neck: Negative for mass or adenopathy in the neck. Upper chest: Apical scarring bilaterally without acute abnormality. Review of the MIP images confirms the above findings CTA HEAD FINDINGS Anterior circulation: Mild atherosclerotic disease in the cavernous carotid bilaterally without stenosis. Anterior and middle cerebral arteries patent bilaterally without stenosis. Branch occlusion of small left M3 branch involving the superior division consistent with aphasia symptoms. Posterior circulation: Both vertebral arteries patent to the basilar. PICA patent bilaterally. Basilar widely patent. Superior cerebellar and posterior cerebral arteries patent bilaterally. Fetal origin of the posterior cerebral artery bilaterally. Venous sinuses: Normal venous enhancement Anatomic variants: None Review of the MIP images confirms the above findings CT Brain Perfusion Findings: ASPECTS: 10 CBF (<30%) Volume: 47mL Perfusion (Tmax>6.0s) volume: 40mL Mismatch Volume: 66mL Infarction Location:No infarct identified. There is some delayed perfusion in the left middle frontal gyrus and region of the speech area. This shows 11 ml of delayed perfusion on T-max greater than 4 seconds but 0 mL on the T-max greater than 6 seconds. IMPRESSION: 1. Positive for left M3 branch occlusion of the superior division left MCA 2. No  significant carotid or vertebral artery stenosis in the neck. 3. CT perfusion negative for acute infarct or ischemia using the standard parameters. However on T-max greater than 4 seconds there is 11 mm of delayed perfusion in the area of the speech center. 4. These results were called by telephone at the time of interpretation on 10/12/2019 at 11:53 am to provider ERIC [facility] , who verbally acknowledged these results. Electronically Signed   By: Franchot Gallo M.D.   On: 10/12/2019 11:54   MR BRAIN WO CONTRAST  Result Date: 10/13/2019 CLINICAL DATA:  85 year old female code stroke presentation yesterday with distal left MCA branch occlusion, not treated endovascularly. No IV tPA given (outside of time window). EXAM: MRI HEAD WITHOUT CONTRAST TECHNIQUE: Multiplanar, multiecho pulse sequences of the brain and surrounding structures were obtained without intravenous contrast. COMPARISON:  CT head and CTA CTP yesterday. [facility] Brain MRI 09/27/2016. FINDINGS: Study is intermittently degraded by motion artifact despite repeated imaging attempts. Brain: 3 cm area of restricted diffusion in the left inferior frontal gyrus and left operculum (series 7, image 53).  This is fairly consistent with the T-max greater than 4 seconds abnormality on CTP yesterday. Minimal additional subcortical white matter restricted diffusion in the left frontal lobe. No contralateral or posterior fossa restricted diffusion. T2 and FLAIR hyperintensity in the affected parenchyma with no associated hemorrhage or mass effect. Chronic right supraclinoid meningioma measuring roughly 22 mm diameter appears not significantly changed in size since 2018. Chronic mass effect on the right inferior frontal gyrus, and now there does appear to be mild associated regional cerebral edema on FLAIR series 11, image 12-new since 2018. Stable much smaller left posterior tentorium meningioma on series 10, image 10, measuring a about 9 mm. No other  intracranial mass or mass effect. There is evidence of a small new but chronic infarct in the left cerebellum on series 10, image 7. Stable mild T2 heterogeneity in the left thalamus. No other new signal abnormality. No ventriculomegaly, extra-axial fluid collection or acute intracranial hemorrhage. Cervicomedullary junction and pituitary are within normal limits. Vascular: Major intracranial vascular flow voids are stable since 2018. Skull and upper cervical spine: Stable and negative for age visible cervical spine. Bone marrow signal remains within normal limits. Sinuses/Orbits: Stable, negative. Other: Mastoids remain clear. Visible internal auditory structures appear normal. Scalp and face soft tissues appear negative. IMPRESSION: 1. Approximately 3 cm infarct in the left inferior frontal gyrus and operculum with no hemorrhage or mass effect. This closely resembles the T-max > 4s abnormality on CTP yesterday. 2. Chronic 2.2 cm right supraclinoid meningioma. Evidence of mild new cerebral edema in the adjacent right inferior frontal gyrus since a 2018 MRI. No increased mass effect. Stable much smaller chronic left tentorium meningioma. 3. 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 Electronically Signed   By: Genevie Ann M.D.   On: 10/13/2019 11:34   CT Code Stroke Cerebral Perfusion with contrast  Result Date: 10/12/2019 CLINICAL DATA:  Acute neuro deficit. Aphasia. Last seen normal 1600 hours yesterday. EXAM: CT ANGIOGRAPHY HEAD AND NECK CT PERFUSION BRAIN TECHNIQUE: Multidetector CT imaging of the head and neck was performed using the standard protocol during bolus administration of intravenous contrast. Multiplanar CT image reconstructions and MIPs were obtained to evaluate the vascular anatomy. Carotid stenosis measurements (when applicable) are obtained utilizing NASCET criteria, using the distal internal carotid diameter as the denominator. Multiphase CT imaging of the brain was  performed following IV bolus contrast injection. Subsequent parametric perfusion maps were calculated using RAPID software. CONTRAST:  124mL OMNIPAQUE IOHEXOL 350 MG/ML SOLN COMPARISON:  CT head 10/12/2019 FINDINGS: CTA NECK FINDINGS Aortic arch: Standard branching. Imaged portion shows no evidence of aneurysm or dissection. No significant stenosis of the major arch vessel origins. Right carotid system: Right carotid artery widely patent without stenosis or dissection. Minimal atherosclerotic disease right carotid bifurcation. Left carotid system: Left carotid widely patent without stenosis or dissection. Minimal atherosclerotic disease at the left carotid bifurcation. Vertebral arteries: Both vertebral arteries widely patent without stenosis or irregularity. Skeleton: Degenerative changes in the cervical spine with spurring at C5-6 and C6-7. Erosive changes in the dens which could be due to rheumatoid arthritis or CPPD. Other neck: Negative for mass or adenopathy in the neck. Upper chest: Apical scarring bilaterally without acute abnormality. Review of the MIP images confirms the above findings CTA HEAD FINDINGS Anterior circulation: Mild atherosclerotic disease in the cavernous carotid bilaterally without stenosis. Anterior and middle cerebral arteries patent bilaterally without stenosis. Branch occlusion of small left M3 branch involving the superior division consistent  with aphasia symptoms. Posterior circulation: Both vertebral arteries patent to the basilar. PICA patent bilaterally. Basilar widely patent. Superior cerebellar and posterior cerebral arteries patent bilaterally. Fetal origin of the posterior cerebral artery bilaterally. Venous sinuses: Normal venous enhancement Anatomic variants: None Review of the MIP images confirms the above findings CT Brain Perfusion Findings: ASPECTS: 10 CBF (<30%) Volume: 108mL Perfusion (Tmax>6.0s) volume: 64mL Mismatch Volume: 70mL Infarction Location:No infarct identified.  There is some delayed perfusion in the left middle frontal gyrus and region of the speech area. This shows 11 ml of delayed perfusion on T-max greater than 4 seconds but 0 mL on the T-max greater than 6 seconds. IMPRESSION: 1. Positive for left M3 branch occlusion of the superior division left MCA 2. No significant carotid or vertebral artery stenosis in the neck. 3. CT perfusion negative for acute infarct or ischemia using the standard parameters. However on T-max greater than 4 seconds there is 11 mm of delayed perfusion in the area of the speech center. 4. These results were called by telephone at the time of interpretation on 10/12/2019 at 11:53 am to provider ERIC [facility] , who verbally acknowledged these results. Electronically Signed   By: Franchot Gallo M.D.   On: 10/12/2019 11:54   ECHOCARDIOGRAM COMPLETE  Result Date: 10/13/2019    ECHOCARDIOGRAM REPORT   Patient Name:   Summer [name] Date of Exam: 10/13/2019 Medical Rec #:  [id number]    Height:       68.0 in Accession #:    [id number]   Weight:       141.3 lb Date of Birth:  12-Nov-1934   BSA:          1.763 m Patient Age:    47 years     BP:           142/68 mmHg Patient Gender: F            HR:           73 bpm. Exam Location:  Inpatient Procedure: 2D Echo, Cardiac Doppler and Color Doppler Indications:    CVA  History:        Patient has no prior history of Echocardiogram examinations.                 Stroke, Signs/Symptoms:Altered Mental Status; Risk                 Factors:Hypertension.  Sonographer:    Dustin Flock Referring Phys: [place]  1. Left ventricular ejection fraction, by estimation, is 55 to 60%. The left ventricle has normal function. The left ventricle has no regional wall motion abnormalities. Left ventricular diastolic parameters are consistent with Grade I diastolic dysfunction (impaired relaxation). Elevated left atrial pressure.  2. Right ventricular systolic function is normal. The right ventricular size is  normal. There is mildly elevated pulmonary artery systolic pressure.  3. The mitral valve is normal in structure. Mild to moderate mitral valve regurgitation. No evidence of mitral stenosis.  4. The aortic valve is tricuspid. Aortic valve regurgitation is not visualized. Mild aortic valve sclerosis is present, with no evidence of aortic valve stenosis.  5. The inferior vena cava is normal in size with greater than 50% respiratory variability, suggesting right atrial pressure of 3 mmHg. FINDINGS  Left Ventricle: Left ventricular ejection fraction, by estimation, is 55 to 60%. The left ventricle has normal function. The left ventricle has no regional wall motion abnormalities. The left ventricular internal cavity size was  normal in size. There is  no left ventricular hypertrophy. Left ventricular diastolic parameters are consistent with Grade I diastolic dysfunction (impaired relaxation). Elevated left atrial pressure. Right Ventricle: The right ventricular size is normal. Right ventricular systolic function is normal. There is mildly elevated pulmonary artery systolic pressure. The tricuspid regurgitant velocity is 3.16 m/s, and with an assumed right atrial pressure of 3 mmHg, the estimated right ventricular systolic pressure is 02.6 mmHg. Left Atrium: Left atrial size was normal in size. Right Atrium: Right atrial size was normal in size. Pericardium: There is no evidence of pericardial effusion. Mitral Valve: The mitral valve is normal in structure. Normal mobility of the mitral valve leaflets. Mild to moderate mitral valve regurgitation. No evidence of mitral valve stenosis. Tricuspid Valve: The tricuspid valve is normal in structure. Tricuspid valve regurgitation is mild . No evidence of tricuspid stenosis. Aortic Valve: The aortic valve is tricuspid. Aortic valve regurgitation is not visualized. Mild aortic valve sclerosis is present, with no evidence of aortic valve stenosis. Pulmonic Valve: The pulmonic valve  was not well visualized. Pulmonic valve regurgitation is not visualized. No evidence of pulmonic stenosis. Aorta: The aortic root is normal in size and structure. Venous: The inferior vena cava is normal in size with greater than 50% respiratory variability, suggesting right atrial pressure of 3 mmHg.  LEFT VENTRICLE PLAX 2D LVIDd:         4.50 cm  Diastology LVIDs:         3.40 cm  LV e' lateral:   7.72 cm/s LV PW:         1.10 cm  LV E/e' lateral: 15.0 LV IVS:        1.00 cm  LV e' medial:    5.00 cm/s LVOT diam:     1.90 cm  LV E/e' medial:  23.2 LV SV:         62 LV SV Index:   35 LVOT Area:     2.84 cm  RIGHT VENTRICLE RV Basal diam:  3.00 cm RV S prime:     13.60 cm/s TAPSE (M-mode): 2.6 cm LEFT ATRIUM             Index       RIGHT ATRIUM           Index LA diam:        3.00 cm 1.70 cm/m  RA Area:     13.20 cm LA Vol (A2C):   35.3 ml 20.02 ml/m RA Volume:   32.10 ml  18.20 ml/m LA Vol (A4C):   32.8 ml 18.60 ml/m LA Biplane Vol: 37.5 ml 21.27 ml/m  AORTIC VALVE LVOT Vmax:   94.20 cm/s LVOT Vmean:  61.500 cm/s LVOT VTI:    0.220 m  AORTA Ao Root diam: 2.50 cm MITRAL VALVE                TRICUSPID VALVE MV Area (PHT): 4.21 cm     TR Peak grad:   39.9 mmHg MV Decel Time: 180 msec     TR Vmax:        316.00 cm/s MV E velocity: 116.00 cm/s MV A velocity: 107.00 cm/s  SHUNTS MV E/A ratio:  1.08         Systemic VTI:  0.22 m                             Systemic Diam: 1.90 cm Kirk Ruths  MD Electronically signed by Kirk Ruths MD Signature Date/Time: 10/13/2019/2:44:57 PM    Final    CT HEAD CODE STROKE WO CONTRAST  Result Date: 10/12/2019 CLINICAL DATA:  Code stroke. Focal neuro deficit greater than 6 hours. Rule out stroke. EXAM: CT HEAD WITHOUT CONTRAST TECHNIQUE: Contiguous axial images were obtained from the base of the skull through the vertex without intravenous contrast. COMPARISON:  CT head 07/14/2017.  MRI head 09/27/2016 FINDINGS: Brain: Negative for acute infarct or hemorrhage. Generalized  atrophy without hydrocephalus. Densely calcified right para clinoid mass measures 2.2 x 2.1 cm and is unchanged from the prior study. Second 9 mm calcified meningioma below the left tentorium also unchanged. No significant cerebral edema. Vascular: Negative for hyperdense vessel Skull: Negative Sinuses/Orbits: Paranasal sinuses clear.  Negative orbit. Other: None ASPECTS ([place] Stroke Program Early CT Score) - Ganglionic level infarction (caudate, lentiform nuclei, internal capsule, insula, M1-M3 cortex): 7 - Supraganglionic infarction (M4-M6 cortex): 3 Total score (0-10 with 10 being normal): 10 IMPRESSION: 1. No acute infarct or hemorrhage 2. ASPECTS is 10 3. Stable calcified meningioma total right para clinoid region and left tentorium. 4. Code stroke imaging results were communicated on 10/12/2019 at 11:24 am to provider Dr. Cheral Marker via Shea Evans Electronically Signed   By: Franchot Gallo M.D.   On: 10/12/2019 11:24      HISTORY OF PRESENT ILLNESS Summer [name] is an 85 y.o. female with a prior history of mild dementia who presents to the ED as a Code Stroke for assessment of acute onset aphasia. LKW was 1400 yesterday 10/11/2019, when her son spoke to her on the telephone. Daughter in law visited at [place] today for the "morning check up" and noticed that patient would not speak to her - daughter in law thought that she was just waking up. However, when the phone rang and patient tried to answer it, her speech was garbled. Daughter in law called 20. On EMS arrival she followed all commands, was able to walk, but could not speak, except for "Ok" and otherwise garbled speech - left side of mouth would move when trying to speak, but right side of mouth would not move. Code Stroke was called en route. On arrival to the ED, she continued to be aphasic. tPA not given as out of tPA time window. NIHSS: 4. mRS: 1. CTA w/ L M3 branch occlusion with no core/penumbra per standard parameters. TMax >4 and 28mm delayed perfusion  in speech center. Taken for possible IR.   HOSPITAL COURSE Ms. QUORRA ROSENE is a 85 y.o. female with history of mild dementia and hypertension presenting with acute onset aphasia. She did not receive IV t-PA due to late presentation (>4.5 hours from time of onset). Taken for Cerebral Angiogram - Dr Estanislado Pandy - no LVO - distal posterior parasylvian M4 branch occlusion - no intervention.   Stroke: left MCA territory infarct - embolic - source unknown, suspicious for AF Resultant aphasia Code Stroke CT Head -  No acute infarct or hemorrhage. ASPECTS is 10. Stable calcified meningioma total right para clinoid region and left tentorium.  MRI head - Approximately 3 cm infarct in the left inferior frontal gyrus and operculum with no hemorrhage or mass effect. 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 CTA H&N - L M3 branch occlusion of the superior division left MCA.  CT Perfusion - no infarct or penumbra Cerebral angio distal posterior parasylvian branch M4 occlusion. No intervention. 2D Echo - EF 55 -  60%. No cardiac source of emboli identified.  Sars Corona Virus 2 - negative Loop placement 7/6 ([place]) LDL - 96 HgbA1c - 5.3 aspirin 81 mg daily prior to admission, now on aspirin 81 mg  And plavix 75 mg daily x 3 weeks and then plavix alone.   Therapy recommendations:  HH PT, OT, HH SLP  Disposition:  return home - lives alone PTA, son next door, plans 24h care at d/c at her home   Hypertension Home BP meds: Zestril Stable Permissive hypertension but gradually normalize in 5-7 days  Resumed home meds  Long-term BP goal normotensive   Hyperlipidemia Home Lipid lowering medication: none  LDL - 96, goal < 70 Current lipid lowering medication: Lipitor 40 mg daily Continue statin at discharge   Other Stroke Risk Factors Advanced age Previous stroke by imaging   Other Active Problems HOH Mild dementia -> now on Seroquel Q HS for confusion / agitation in ICU -  sleepy into am -> will decrease to 12.5. stopping at d/c CKD - stage 3b - creatinine - 1.46->1.60->1.18->1.24->1.29  DISCHARGE EXAM Blood pressure 132/73, pulse 69, temperature 97.8 F (36.6 C), temperature source Oral, resp. rate 16, height 5\' 8"  (1.727 m), weight 64.1 kg, SpO2 100 %. Pleasant elderly Caucasian lady not in distress. . Afebrile. Head is nontraumatic. Neck is supple without bruit.    Cardiac exam no murmur or gallop. Lungs are clear to auscultation. Distal pulses are well felt. Neurological Exam : Awake alert moderate expressive aphasia and word finding difficulties and nonfluent speech.  Follows commands well.  Slight difficulty with naming and repetition comprehension.  Extraocular movements full range without nystagmus.  Mild right lower facial asymmetry.  Tongue midline.  Motor system exam no drift or focal weakness.  Diminished fine finger movements on the right.  Orbits left over right upper extremity.  Sensation intact.  Gait not tested.    Discharge Diet   Dysphagia 3 thin liquids  DISCHARGE PLAN Disposition:  Return home. Family to provide care Home health PT, OT, SLP aspirin 81 mg daily and clopidogrel 75 mg daily for secondary stroke prevention for 3 weeks then PLAVIX alone. Ongoing stroke risk factor control by Primary Care Physician at time of discharge Follow-up PCP No primary care provider on file. in 2 weeks. Follow-up in [facility] in 4 weeks, office to schedule an appointment.  Follow-up [facility] for implantable loop recorder wound check in 10-14 days, office to schedule an appointment.  Loop recorder to be monitored by [facility] for atrial fibrillation as source of stroke. If found, they will notify patient.  35 minutes were spent preparing discharge.  Burnetta Sabin, MSN, APRN, ANVP-BC, AGPCNP-BC Advanced Practice Stroke Nurse [place] for Schedule & Pager  information 10/16/2019 3:21 PM   I have personally obtained history,examined this patient, reviewed notes, independently viewed imaging studies, participated in medical decision making and plan of care.ROS completed by me personally and pertinent positives fully documented  I have made any additions or clarifications directly to the above note. Agree with note above.    Antony Contras, MD Medical Director [facility] Pager: [phone] 10/16/2019 5:14 PM

## 2019-10-16 NOTE — TOC Transition Note (Signed)
Transition of Care [facility]) - CM/SW Discharge Note   Patient Details  Name: Summer [name] MRN: [id number] Date of Birth: 03-11-1935  [facility] CM/SW Contact:  Pollie Friar, RN Phone Number: 10/16/2019, 3:45 PM   Clinical Narrative:    Pt is discharging home with [facility] services through [place]. Cindy with [facility] aware of d/c home.  [place] for home delivered to the room per [employer].  Pt has supervision at home and transportation to home.    Final next level of care: Home w Home Health Services Barriers to Discharge: No Barriers Identified   Patient Goals and CMS Choice   CMS Medicare.gov Compare Post Acute Care list provided to:: Patient Represenative (must comment) Choice offered to / list presented to : Adult Children  Discharge Placement                       Discharge Plan and Services   Discharge Planning Services: CM Consult Post Acute Care Choice: Home Health          DME Arranged: [place] rolling DME Agency: [employer] Date DME Agency Contacted: 10/16/19   Representative spoke with at DME Agency: Zack HH Arranged: PT, OT, Speech Therapy [place]: [place] Date [place]: 10/16/19   Representative spoke with at [place]: Jenny Reichmann updated on d/c home today  Social Determinants of Health (SDOH) Interventions     Readmission Risk Interventions No flowsheet data found.

## 2019-10-16 NOTE — Consult Note (Addendum)
ELECTROPHYSIOLOGY CONSULT NOTE  Patient ID: Summer [name] MRN: [id number], DOB/AGE: [birth year]   Admit date: 10/12/2019 Date of Consult: 10/16/2019  Primary Physician: No primary care provider on file. Primary Cardiologist: No primary care provider on file.  Primary Electrophysiologist: New to Dr. Rayann Heman Reason for Consultation: Cryptogenic stroke; recommendations regarding Implantable Loop Recorder Insurance: [place] [employer]  History of Present Illness EP has been asked to evaluate Summer [name] for placement of an implantable loop recorder to monitor for atrial fibrillation by Dr Leonie Man.  The patient was admitted on 10/12/2019 with acute aphasia.  Imaging demonstrated left MCA infarct. No tPA with late presentation.  They have undergone workup for stroke including echocardiogram and CTA Head and Neck. The patient has been monitored on telemetry which has demonstrated sinus rhythm and sinus tachycardia. 1 episode NSVT noted. Inpatient stroke work-up will not require a TEE per Neurology.   Echocardiogram this admission demonstrated EF 55-60%.  Lab work is reviewed.  Discussed with patient and daughter-in-law at bedside. Prior to admission, the patient denies chest pain, shortness of breath, dizziness, palpitations, or syncope.  They are recovering from their stroke with plans to return home with HH/PT  at discharge.  History reviewed. No pertinent past medical history.   Surgical History:  Past Surgical History:  Procedure Laterality Date  . RADIOLOGY WITH ANESTHESIA N/A 10/12/2019   Procedure: IR WITH ANESTHESIA;  Surgeon: Luanne Bras, MD;  Location: [facility];  Service: Radiology;  Laterality: N/A;     Medications Prior to Admission  Medication Sig Dispense Refill Last Dose  . aspirin 81 MG chewable tablet Chew 81 mg by mouth daily.   10/11/2019 at Unknown time  . Cholecalciferol 25 MCG (1000 UT) tablet Take 1,000 Units by mouth daily.   10/11/2019 at Unknown time  . citalopram (CELEXA) 20  MG tablet Take 20 mg by mouth daily.   10/11/2019 at Unknown time  . Coenzyme Q10 (COQ10 PO) Take 1 tablet by mouth daily.   10/11/2019 at Unknown time  . donepezil (ARICEPT) 10 MG tablet Take 20 mg by mouth at bedtime.   10/11/2019 at Unknown time  . lisinopril (ZESTRIL) 10 MG tablet Take 10 mg by mouth daily.   10/11/2019 at Unknown time  . ROCKLATAN 0.02-0.005 % SOLN Place 1 drop into both eyes every evening.   10/11/2019 at Unknown time    Inpatient Medications:  . aspirin EC  81 mg Oral Daily  . atorvastatin  40 mg Oral Daily  . citalopram  20 mg Oral Daily  . clopidogrel  75 mg Oral Daily  . donepezil  20 mg Oral QHS  . lisinopril  10 mg Oral Daily  . mouth rinse  15 mL Mouth Rinse BID  . Netarsudil-Latanoprost  1 drop Both Eyes q morning - 10a  . QUEtiapine  12.5 mg Oral QHS    Allergies: No Known Allergies  Social History   Socioeconomic History  . Marital status: Widowed    Spouse name: Not on file  . Number of children: Not on file  . Years of education: Not on file  . Highest education level: Not on file  Occupational History  . Not on file  Tobacco Use  . Smoking status: Never Smoker  . Smokeless tobacco: Never Used  . Tobacco comment: Per son  Vaping Use  . Vaping Use: Never used  Substance and Sexual Activity  . Alcohol use: Never  . Drug use: Never  . Sexual activity: Not on  file  Other Topics Concern  . Not on file  Social History Narrative  . Not on file   Social Determinants of Health   Financial Resource Strain:   . Difficulty of Paying Living Expenses:   Food Insecurity:   . Worried About Charity fundraiser in the Last Year:   . Arboriculturist in the Last Year:   Transportation Needs:   . Film/video editor (Medical):   Marland Kitchen Lack of Transportation (Non-Medical):   Physical Activity:   . Days of Exercise per Week:   . Minutes of Exercise per Session:   Stress:   . Feeling of Stress :   Social Connections:   . Frequency of Communication with  Friends and Family:   . Frequency of Social Gatherings with Friends and Family:   . Attends Religious Services:   . Active Member of Clubs or Organizations:   . Attends Archivist Meetings:   Marland Kitchen Marital Status:   Intimate Partner Violence:   . Fear of Current or Ex-Partner:   . Emotionally Abused:   Marland Kitchen Physically Abused:   . Sexually Abused:      No family history on file.    Review of Systems: All other systems reviewed and are otherwise negative except as noted above.  Physical Exam: Vitals:   10/15/19 1952 10/15/19 2349 10/16/19 0433 10/16/19 0846  BP: 119/60 139/78 129/67 118/72  Pulse: 76 81 71 68  Resp: 17 16 15 20   Temp: 98.6 F (37 C) 98.4 F (36.9 C) 98.7 F (37.1 C) 97.9 F (36.6 C)  TempSrc: Oral Oral Oral Oral  SpO2: 97% 96% 100% 100%  Weight:      Height:        GEN- The patient is well appearing, alert and oriented x 3 today.   Head- normocephalic, atraumatic Eyes-  Sclera clear, conjunctiva pink Ears- hearing intact Oropharynx- clear Neck- supple Lungs- Clear to ausculation bilaterally, normal work of breathing Heart- Regular rate and rhythm, no murmurs, rubs or gallops  GI- soft, NT, ND, + BS Extremities- no clubbing, cyanosis, or edema MS- no significant deformity or atrophy Skin- no rash or lesion Psych- euthymic mood, full affect   Labs:   Lab Results  Component Value Date   WBC 8.9 10/15/2019   HGB 12.7 10/15/2019   HCT 40.8 10/15/2019   MCV 91.9 10/15/2019   PLT 183 10/15/2019    Recent Labs  Lab 10/12/19 1111 10/12/19 1115 10/15/19 0708  NA 139   < > 139  K 4.6   < > 3.7  CL 105   < > 105  CO2 26   < > 23  BUN 23   < > 18  CREATININE 1.46*   < > 1.29*  CALCIUM 9.0   < > 8.8*  PROT 6.7  --   --   BILITOT 0.7  --   --   ALKPHOS 67  --   --   ALT 13  --   --   AST 24  --   --   GLUCOSE 92   < > 86   < > = values in this interval not displayed.     Radiology/Studies: CT Code Stroke CTA Head W/WO  contrast  Result Date: 10/12/2019 CLINICAL DATA:  Acute neuro deficit. Aphasia. Last seen normal 1600 hours yesterday. EXAM: CT ANGIOGRAPHY HEAD AND NECK CT PERFUSION BRAIN TECHNIQUE: Multidetector CT imaging of the head and neck was performed using the  standard protocol during bolus administration of intravenous contrast. Multiplanar CT image reconstructions and MIPs were obtained to evaluate the vascular anatomy. Carotid stenosis measurements (when applicable) are obtained utilizing NASCET criteria, using the distal internal carotid diameter as the denominator. Multiphase CT imaging of the brain was performed following IV bolus contrast injection. Subsequent parametric perfusion maps were calculated using RAPID software. CONTRAST:  150mL OMNIPAQUE IOHEXOL 350 MG/ML SOLN COMPARISON:  CT head 10/12/2019 FINDINGS: CTA NECK FINDINGS Aortic arch: Standard branching. Imaged portion shows no evidence of aneurysm or dissection. No significant stenosis of the major arch vessel origins. Right carotid system: Right carotid artery widely patent without stenosis or dissection. Minimal atherosclerotic disease right carotid bifurcation. Left carotid system: Left carotid widely patent without stenosis or dissection. Minimal atherosclerotic disease at the left carotid bifurcation. Vertebral arteries: Both vertebral arteries widely patent without stenosis or irregularity. Skeleton: Degenerative changes in the cervical spine with spurring at C5-6 and C6-7. Erosive changes in the dens which could be due to rheumatoid arthritis or CPPD. Other neck: Negative for mass or adenopathy in the neck. Upper chest: Apical scarring bilaterally without acute abnormality. Review of the MIP images confirms the above findings CTA HEAD FINDINGS Anterior circulation: Mild atherosclerotic disease in the cavernous carotid bilaterally without stenosis. Anterior and middle cerebral arteries patent bilaterally without stenosis. Branch occlusion of small  left M3 branch involving the superior division consistent with aphasia symptoms. Posterior circulation: Both vertebral arteries patent to the basilar. PICA patent bilaterally. Basilar widely patent. Superior cerebellar and posterior cerebral arteries patent bilaterally. Fetal origin of the posterior cerebral artery bilaterally. Venous sinuses: Normal venous enhancement Anatomic variants: None Review of the MIP images confirms the above findings CT Brain Perfusion Findings: ASPECTS: 10 CBF (<30%) Volume: 23mL Perfusion (Tmax>6.0s) volume: 86mL Mismatch Volume: 79mL Infarction Location:No infarct identified. There is some delayed perfusion in the left middle frontal gyrus and region of the speech area. This shows 11 ml of delayed perfusion on T-max greater than 4 seconds but 0 mL on the T-max greater than 6 seconds. IMPRESSION: 1. Positive for left M3 branch occlusion of the superior division left MCA 2. No significant carotid or vertebral artery stenosis in the neck. 3. CT perfusion negative for acute infarct or ischemia using the standard parameters. However on T-max greater than 4 seconds there is 11 mm of delayed perfusion in the area of the speech center. 4. These results were called by telephone at the time of interpretation on 10/12/2019 at 11:53 am to provider ERIC [facility] , who verbally acknowledged these results. Electronically Signed   By: Franchot Gallo M.D.   On: 10/12/2019 11:54   CT Code Stroke CTA Neck W/WO contrast  Result Date: 10/12/2019 CLINICAL DATA:  Acute neuro deficit. Aphasia. Last seen normal 1600 hours yesterday. EXAM: CT ANGIOGRAPHY HEAD AND NECK CT PERFUSION BRAIN TECHNIQUE: Multidetector CT imaging of the head and neck was performed using the standard protocol during bolus administration of intravenous contrast. Multiplanar CT image reconstructions and MIPs were obtained to evaluate the vascular anatomy. Carotid stenosis measurements (when applicable) are obtained utilizing NASCET criteria,  using the distal internal carotid diameter as the denominator. Multiphase CT imaging of the brain was performed following IV bolus contrast injection. Subsequent parametric perfusion maps were calculated using RAPID software. CONTRAST:  140mL OMNIPAQUE IOHEXOL 350 MG/ML SOLN COMPARISON:  CT head 10/12/2019 FINDINGS: CTA NECK FINDINGS Aortic arch: Standard branching. Imaged portion shows no evidence of aneurysm or dissection. No significant stenosis of the major arch  vessel origins. Right carotid system: Right carotid artery widely patent without stenosis or dissection. Minimal atherosclerotic disease right carotid bifurcation. Left carotid system: Left carotid widely patent without stenosis or dissection. Minimal atherosclerotic disease at the left carotid bifurcation. Vertebral arteries: Both vertebral arteries widely patent without stenosis or irregularity. Skeleton: Degenerative changes in the cervical spine with spurring at C5-6 and C6-7. Erosive changes in the dens which could be due to rheumatoid arthritis or CPPD. Other neck: Negative for mass or adenopathy in the neck. Upper chest: Apical scarring bilaterally without acute abnormality. Review of the MIP images confirms the above findings CTA HEAD FINDINGS Anterior circulation: Mild atherosclerotic disease in the cavernous carotid bilaterally without stenosis. Anterior and middle cerebral arteries patent bilaterally without stenosis. Branch occlusion of small left M3 branch involving the superior division consistent with aphasia symptoms. Posterior circulation: Both vertebral arteries patent to the basilar. PICA patent bilaterally. Basilar widely patent. Superior cerebellar and posterior cerebral arteries patent bilaterally. Fetal origin of the posterior cerebral artery bilaterally. Venous sinuses: Normal venous enhancement Anatomic variants: None Review of the MIP images confirms the above findings CT Brain Perfusion Findings: ASPECTS: 10 CBF (<30%) Volume:  43mL Perfusion (Tmax>6.0s) volume: 5mL Mismatch Volume: 72mL Infarction Location:No infarct identified. There is some delayed perfusion in the left middle frontal gyrus and region of the speech area. This shows 11 ml of delayed perfusion on T-max greater than 4 seconds but 0 mL on the T-max greater than 6 seconds. IMPRESSION: 1. Positive for left M3 branch occlusion of the superior division left MCA 2. No significant carotid or vertebral artery stenosis in the neck. 3. CT perfusion negative for acute infarct or ischemia using the standard parameters. However on T-max greater than 4 seconds there is 11 mm of delayed perfusion in the area of the speech center. 4. These results were called by telephone at the time of interpretation on 10/12/2019 at 11:53 am to provider ERIC [facility] , who verbally acknowledged these results. Electronically Signed   By: Franchot Gallo M.D.   On: 10/12/2019 11:54   MR BRAIN WO CONTRAST  Result Date: 10/13/2019 CLINICAL DATA:  85 year old female code stroke presentation yesterday with distal left MCA branch occlusion, not treated endovascularly. No IV tPA given (outside of time window). EXAM: MRI HEAD WITHOUT CONTRAST TECHNIQUE: Multiplanar, multiecho pulse sequences of the brain and surrounding structures were obtained without intravenous contrast. COMPARISON:  CT head and CTA CTP yesterday. [facility] Brain MRI 09/27/2016. FINDINGS: Study is intermittently degraded by motion artifact despite repeated imaging attempts. Brain: 3 cm area of restricted diffusion in the left inferior frontal gyrus and left operculum (series 7, image 53). This is fairly consistent with the T-max greater than 4 seconds abnormality on CTP yesterday. Minimal additional subcortical white matter restricted diffusion in the left frontal lobe. No contralateral or posterior fossa restricted diffusion. T2 and FLAIR hyperintensity in the affected parenchyma with no associated hemorrhage or mass effect. Chronic  right supraclinoid meningioma measuring roughly 22 mm diameter appears not significantly changed in size since 2018. Chronic mass effect on the right inferior frontal gyrus, and now there does appear to be mild associated regional cerebral edema on FLAIR series 11, image 12-new since 2018. Stable much smaller left posterior tentorium meningioma on series 10, image 10, measuring a about 9 mm. No other intracranial mass or mass effect. There is evidence of a small new but chronic infarct in the left cerebellum on series 10, image 7. Stable mild T2 heterogeneity in the  left thalamus. No other new signal abnormality. No ventriculomegaly, extra-axial fluid collection or acute intracranial hemorrhage. Cervicomedullary junction and pituitary are within normal limits. Vascular: Major intracranial vascular flow voids are stable since 2018. Skull and upper cervical spine: Stable and negative for age visible cervical spine. Bone marrow signal remains within normal limits. Sinuses/Orbits: Stable, negative. Other: Mastoids remain clear. Visible internal auditory structures appear normal. Scalp and face soft tissues appear negative. IMPRESSION: 1. Approximately 3 cm infarct in the left inferior frontal gyrus and operculum with no hemorrhage or mass effect. This closely resembles the T-max > 4s abnormality on CTP yesterday. 2. Chronic 2.2 cm right supraclinoid meningioma. Evidence of mild new cerebral edema in the adjacent right inferior frontal gyrus since a 2018 MRI. No increased mass effect. Stable much smaller chronic left tentorium meningioma. 3. 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 Electronically Signed   By: Genevie Ann M.D.   On: 10/13/2019 11:34   CT Code Stroke Cerebral Perfusion with contrast  Result Date: 10/12/2019 CLINICAL DATA:  Acute neuro deficit. Aphasia. Last seen normal 1600 hours yesterday. EXAM: CT ANGIOGRAPHY HEAD AND NECK CT PERFUSION BRAIN TECHNIQUE: Multidetector CT imaging of  the head and neck was performed using the standard protocol during bolus administration of intravenous contrast. Multiplanar CT image reconstructions and MIPs were obtained to evaluate the vascular anatomy. Carotid stenosis measurements (when applicable) are obtained utilizing NASCET criteria, using the distal internal carotid diameter as the denominator. Multiphase CT imaging of the brain was performed following IV bolus contrast injection. Subsequent parametric perfusion maps were calculated using RAPID software. CONTRAST:  135mL OMNIPAQUE IOHEXOL 350 MG/ML SOLN COMPARISON:  CT head 10/12/2019 FINDINGS: CTA NECK FINDINGS Aortic arch: Standard branching. Imaged portion shows no evidence of aneurysm or dissection. No significant stenosis of the major arch vessel origins. Right carotid system: Right carotid artery widely patent without stenosis or dissection. Minimal atherosclerotic disease right carotid bifurcation. Left carotid system: Left carotid widely patent without stenosis or dissection. Minimal atherosclerotic disease at the left carotid bifurcation. Vertebral arteries: Both vertebral arteries widely patent without stenosis or irregularity. Skeleton: Degenerative changes in the cervical spine with spurring at C5-6 and C6-7. Erosive changes in the dens which could be due to rheumatoid arthritis or CPPD. Other neck: Negative for mass or adenopathy in the neck. Upper chest: Apical scarring bilaterally without acute abnormality. Review of the MIP images confirms the above findings CTA HEAD FINDINGS Anterior circulation: Mild atherosclerotic disease in the cavernous carotid bilaterally without stenosis. Anterior and middle cerebral arteries patent bilaterally without stenosis. Branch occlusion of small left M3 branch involving the superior division consistent with aphasia symptoms. Posterior circulation: Both vertebral arteries patent to the basilar. PICA patent bilaterally. Basilar widely patent. Superior  cerebellar and posterior cerebral arteries patent bilaterally. Fetal origin of the posterior cerebral artery bilaterally. Venous sinuses: Normal venous enhancement Anatomic variants: None Review of the MIP images confirms the above findings CT Brain Perfusion Findings: ASPECTS: 10 CBF (<30%) Volume: 41mL Perfusion (Tmax>6.0s) volume: 35mL Mismatch Volume: 29mL Infarction Location:No infarct identified. There is some delayed perfusion in the left middle frontal gyrus and region of the speech area. This shows 11 ml of delayed perfusion on T-max greater than 4 seconds but 0 mL on the T-max greater than 6 seconds. IMPRESSION: 1. Positive for left M3 branch occlusion of the superior division left MCA 2. No significant carotid or vertebral artery stenosis in the neck. 3. CT perfusion negative for acute infarct  or ischemia using the standard parameters. However on T-max greater than 4 seconds there is 11 mm of delayed perfusion in the area of the speech center. 4. These results were called by telephone at the time of interpretation on 10/12/2019 at 11:53 am to provider ERIC [facility] , who verbally acknowledged these results. Electronically Signed   By: Franchot Gallo M.D.   On: 10/12/2019 11:54   ECHOCARDIOGRAM COMPLETE  Result Date: 10/13/2019    ECHOCARDIOGRAM REPORT   Patient Name:   Summer [name] Date of Exam: 10/13/2019 Medical Rec #:  [id number]    Height:       68.0 in Accession #:    [id number]   Weight:       141.3 lb Date of Birth:  25-Apr-1934   BSA:          1.763 m Patient Age:    68 years     BP:           142/68 mmHg Patient Gender: F            HR:           73 bpm. Exam Location:  Inpatient Procedure: 2D Echo, Cardiac Doppler and Color Doppler Indications:    CVA  History:        Patient has no prior history of Echocardiogram examinations.                 Stroke, Signs/Symptoms:Altered Mental Status; Risk                 Factors:Hypertension.  Sonographer:    Dustin Flock Referring Phys: [place]  1. Left ventricular ejection fraction, by estimation, is 55 to 60%. The left ventricle has normal function. The left ventricle has no regional wall motion abnormalities. Left ventricular diastolic parameters are consistent with Grade I diastolic dysfunction (impaired relaxation). Elevated left atrial pressure.  2. Right ventricular systolic function is normal. The right ventricular size is normal. There is mildly elevated pulmonary artery systolic pressure.  3. The mitral valve is normal in structure. Mild to moderate mitral valve regurgitation. No evidence of mitral stenosis.  4. The aortic valve is tricuspid. Aortic valve regurgitation is not visualized. Mild aortic valve sclerosis is present, with no evidence of aortic valve stenosis.  5. The inferior vena cava is normal in size with greater than 50% respiratory variability, suggesting right atrial pressure of 3 mmHg. FINDINGS  Left Ventricle: Left ventricular ejection fraction, by estimation, is 55 to 60%. The left ventricle has normal function. The left ventricle has no regional wall motion abnormalities. The left ventricular internal cavity size was normal in size. There is  no left ventricular hypertrophy. Left ventricular diastolic parameters are consistent with Grade I diastolic dysfunction (impaired relaxation). Elevated left atrial pressure. Right Ventricle: The right ventricular size is normal. Right ventricular systolic function is normal. There is mildly elevated pulmonary artery systolic pressure. The tricuspid regurgitant velocity is 3.16 m/s, and with an assumed right atrial pressure of 3 mmHg, the estimated right ventricular systolic pressure is 51.7 mmHg. Left Atrium: Left atrial size was normal in size. Right Atrium: Right atrial size was normal in size. Pericardium: There is no evidence of pericardial effusion. Mitral Valve: The mitral valve is normal in structure. Normal mobility of the mitral valve leaflets. Mild to moderate  mitral valve regurgitation. No evidence of mitral valve stenosis. Tricuspid Valve: The tricuspid valve is normal in structure. Tricuspid valve regurgitation is mild .  No evidence of tricuspid stenosis. Aortic Valve: The aortic valve is tricuspid. Aortic valve regurgitation is not visualized. Mild aortic valve sclerosis is present, with no evidence of aortic valve stenosis. Pulmonic Valve: The pulmonic valve was not well visualized. Pulmonic valve regurgitation is not visualized. No evidence of pulmonic stenosis. Aorta: The aortic root is normal in size and structure. Venous: The inferior vena cava is normal in size with greater than 50% respiratory variability, suggesting right atrial pressure of 3 mmHg.  LEFT VENTRICLE PLAX 2D LVIDd:         4.50 cm  Diastology LVIDs:         3.40 cm  LV e' lateral:   7.72 cm/s LV PW:         1.10 cm  LV E/e' lateral: 15.0 LV IVS:        1.00 cm  LV e' medial:    5.00 cm/s LVOT diam:     1.90 cm  LV E/e' medial:  23.2 LV SV:         62 LV SV Index:   35 LVOT Area:     2.84 cm  RIGHT VENTRICLE RV Basal diam:  3.00 cm RV S prime:     13.60 cm/s TAPSE (M-mode): 2.6 cm LEFT ATRIUM             Index       RIGHT ATRIUM           Index LA diam:        3.00 cm 1.70 cm/m  RA Area:     13.20 cm LA Vol (A2C):   35.3 ml 20.02 ml/m RA Volume:   32.10 ml  18.20 ml/m LA Vol (A4C):   32.8 ml 18.60 ml/m LA Biplane Vol: 37.5 ml 21.27 ml/m  AORTIC VALVE LVOT Vmax:   94.20 cm/s LVOT Vmean:  61.500 cm/s LVOT VTI:    0.220 m  AORTA Ao Root diam: 2.50 cm MITRAL VALVE                TRICUSPID VALVE MV Area (PHT): 4.21 cm     TR Peak grad:   39.9 mmHg MV Decel Time: 180 msec     TR Vmax:        316.00 cm/s MV E velocity: 116.00 cm/s MV A velocity: 107.00 cm/s  SHUNTS MV E/A ratio:  1.08         Systemic VTI:  0.22 m                             Systemic Diam: 1.90 cm Kirk Ruths MD Electronically signed by Kirk Ruths MD Signature Date/Time: 10/13/2019/2:44:57 PM    Final    CT HEAD CODE  STROKE WO CONTRAST  Result Date: 10/12/2019 CLINICAL DATA:  Code stroke. Focal neuro deficit greater than 6 hours. Rule out stroke. EXAM: CT HEAD WITHOUT CONTRAST TECHNIQUE: Contiguous axial images were obtained from the base of the skull through the vertex without intravenous contrast. COMPARISON:  CT head 07/14/2017.  MRI head 09/27/2016 FINDINGS: Brain: Negative for acute infarct or hemorrhage. Generalized atrophy without hydrocephalus. Densely calcified right para clinoid mass measures 2.2 x 2.1 cm and is unchanged from the prior study. Second 9 mm calcified meningioma below the left tentorium also unchanged. No significant cerebral edema. Vascular: Negative for hyperdense vessel Skull: Negative Sinuses/Orbits: Paranasal sinuses clear.  Negative orbit. Other: None ASPECTS [facility] Stroke Program Early CT Score) -  Ganglionic level infarction (caudate, lentiform nuclei, internal capsule, insula, M1-M3 cortex): 7 - Supraganglionic infarction (M4-M6 cortex): 3 Total score (0-10 with 10 being normal): 10 IMPRESSION: 1. No acute infarct or hemorrhage 2. ASPECTS is 10 3. Stable calcified meningioma total right para clinoid region and left tentorium. 4. Code stroke imaging results were communicated on 10/12/2019 at 11:24 am to provider Dr. Cheral Marker via Shea Evans Electronically Signed   By: Franchot Gallo M.D.   On: 10/12/2019 11:24   12-lead ECG NSR at 78 bpm, narrow QRS (personally reviewed) All prior EKG's in EPIC reviewed with no documented atrial fibrillation  Telemetry NSR, 1 episodes NSVT noted. Sinus tachycardia noted with PT (personally reviewed)  Assessment and Plan:  1. Cryptogenic stroke The patient presents with cryptogenic stroke.  The patient does not have a TEE planned for this AM.  I spoke at length with the patient about monitoring for afib with an implantable loop recorder.  Risks, benefits, and alteratives to implantable loop recorder were discussed with the patient today.   At this time, the  patient is very clear in their decision to proceed with implantable loop recorder.   Wound care was reviewed with the patient (keep incision clean and dry for 3 days).  Wound check scheduled and entered in AVS. Please call with questions.   Shirley Friar, PA-C 10/16/2019 9:27 AM  I have seen, examined the patient, and reviewed the above assessment and plan.  Changes to above are made where necessary.  On exam, RRR.  The patient has had a stroke of unknown cause.  I agree with Dr Leonie Man that long term monitoring is appropriate.  Risks and benefits to loop recorder placement were discussed with the patient who wishes to proceed.  Co Sign: Thompson Grayer, MD 10/16/2019 1:29 PM

## 2019-10-18 DIAGNOSIS — I69320 Aphasia following cerebral infarction: Secondary | ICD-10-CM | POA: Diagnosis not present

## 2019-10-18 DIAGNOSIS — I69391 Dysphagia following cerebral infarction: Secondary | ICD-10-CM | POA: Diagnosis not present

## 2019-10-18 DIAGNOSIS — I69322 Dysarthria following cerebral infarction: Secondary | ICD-10-CM | POA: Diagnosis not present

## 2019-10-18 DIAGNOSIS — R1312 Dysphagia, oropharyngeal phase: Secondary | ICD-10-CM | POA: Diagnosis not present

## 2019-10-19 DIAGNOSIS — I69391 Dysphagia following cerebral infarction: Secondary | ICD-10-CM | POA: Diagnosis not present

## 2019-10-19 DIAGNOSIS — I69322 Dysarthria following cerebral infarction: Secondary | ICD-10-CM | POA: Diagnosis not present

## 2019-10-19 DIAGNOSIS — I69320 Aphasia following cerebral infarction: Secondary | ICD-10-CM | POA: Diagnosis not present

## 2019-10-19 DIAGNOSIS — R1312 Dysphagia, oropharyngeal phase: Secondary | ICD-10-CM | POA: Diagnosis not present

## 2019-10-23 DIAGNOSIS — R1312 Dysphagia, oropharyngeal phase: Secondary | ICD-10-CM | POA: Diagnosis not present

## 2019-10-23 DIAGNOSIS — E785 Hyperlipidemia, unspecified: Secondary | ICD-10-CM | POA: Diagnosis not present

## 2019-10-23 DIAGNOSIS — I10 Essential (primary) hypertension: Secondary | ICD-10-CM | POA: Diagnosis not present

## 2019-10-23 DIAGNOSIS — I69320 Aphasia following cerebral infarction: Secondary | ICD-10-CM | POA: Diagnosis not present

## 2019-10-23 DIAGNOSIS — I69322 Dysarthria following cerebral infarction: Secondary | ICD-10-CM | POA: Diagnosis not present

## 2019-10-23 DIAGNOSIS — I639 Cerebral infarction, unspecified: Secondary | ICD-10-CM | POA: Diagnosis not present

## 2019-10-23 DIAGNOSIS — I69391 Dysphagia following cerebral infarction: Secondary | ICD-10-CM | POA: Diagnosis not present

## 2019-10-25 DIAGNOSIS — I69320 Aphasia following cerebral infarction: Secondary | ICD-10-CM | POA: Diagnosis not present

## 2019-10-25 DIAGNOSIS — I69391 Dysphagia following cerebral infarction: Secondary | ICD-10-CM | POA: Diagnosis not present

## 2019-10-25 DIAGNOSIS — I69322 Dysarthria following cerebral infarction: Secondary | ICD-10-CM | POA: Diagnosis not present

## 2019-10-25 DIAGNOSIS — R1312 Dysphagia, oropharyngeal phase: Secondary | ICD-10-CM | POA: Diagnosis not present

## 2019-10-26 DIAGNOSIS — R1312 Dysphagia, oropharyngeal phase: Secondary | ICD-10-CM | POA: Diagnosis not present

## 2019-10-26 DIAGNOSIS — I69320 Aphasia following cerebral infarction: Secondary | ICD-10-CM | POA: Diagnosis not present

## 2019-10-26 DIAGNOSIS — I69391 Dysphagia following cerebral infarction: Secondary | ICD-10-CM | POA: Diagnosis not present

## 2019-10-26 DIAGNOSIS — I69322 Dysarthria following cerebral infarction: Secondary | ICD-10-CM | POA: Diagnosis not present

## 2019-10-29 DIAGNOSIS — I69391 Dysphagia following cerebral infarction: Secondary | ICD-10-CM | POA: Diagnosis not present

## 2019-10-29 DIAGNOSIS — I69320 Aphasia following cerebral infarction: Secondary | ICD-10-CM | POA: Diagnosis not present

## 2019-10-29 DIAGNOSIS — R1312 Dysphagia, oropharyngeal phase: Secondary | ICD-10-CM | POA: Diagnosis not present

## 2019-10-29 DIAGNOSIS — I69322 Dysarthria following cerebral infarction: Secondary | ICD-10-CM | POA: Diagnosis not present

## 2019-10-30 ENCOUNTER — Ambulatory Visit (INDEPENDENT_AMBULATORY_CARE_PROVIDER_SITE_OTHER): Payer: Medicare Other | Admitting: Emergency Medicine

## 2019-10-30 ENCOUNTER — Other Ambulatory Visit: Payer: Self-pay

## 2019-10-30 DIAGNOSIS — I639 Cerebral infarction, unspecified: Secondary | ICD-10-CM

## 2019-10-31 DIAGNOSIS — I69320 Aphasia following cerebral infarction: Secondary | ICD-10-CM | POA: Diagnosis not present

## 2019-10-31 DIAGNOSIS — I69322 Dysarthria following cerebral infarction: Secondary | ICD-10-CM | POA: Diagnosis not present

## 2019-10-31 DIAGNOSIS — I69391 Dysphagia following cerebral infarction: Secondary | ICD-10-CM | POA: Diagnosis not present

## 2019-10-31 DIAGNOSIS — R1312 Dysphagia, oropharyngeal phase: Secondary | ICD-10-CM | POA: Diagnosis not present

## 2019-11-01 DIAGNOSIS — I69391 Dysphagia following cerebral infarction: Secondary | ICD-10-CM | POA: Diagnosis not present

## 2019-11-01 DIAGNOSIS — I69322 Dysarthria following cerebral infarction: Secondary | ICD-10-CM | POA: Diagnosis not present

## 2019-11-01 DIAGNOSIS — I69320 Aphasia following cerebral infarction: Secondary | ICD-10-CM | POA: Diagnosis not present

## 2019-11-01 DIAGNOSIS — R1312 Dysphagia, oropharyngeal phase: Secondary | ICD-10-CM | POA: Diagnosis not present

## 2019-11-01 LAB — CUP PACEART INCLINIC DEVICE CHECK
Date Time Interrogation Session: 20210720143521
Implantable Pulse Generator Implant Date: 20210706

## 2019-11-01 NOTE — Progress Notes (Signed)
ILR wound check in clinic. Steri strips removed. Wound well healed. R-waves 0.68 mV. Home monitor transmitting nightly. 1 AF episode that Appears to be SR with PVCs.Questions answered.

## 2019-11-06 DIAGNOSIS — I69320 Aphasia following cerebral infarction: Secondary | ICD-10-CM | POA: Diagnosis not present

## 2019-11-06 DIAGNOSIS — R1312 Dysphagia, oropharyngeal phase: Secondary | ICD-10-CM | POA: Diagnosis not present

## 2019-11-06 DIAGNOSIS — I69322 Dysarthria following cerebral infarction: Secondary | ICD-10-CM | POA: Diagnosis not present

## 2019-11-06 DIAGNOSIS — I69391 Dysphagia following cerebral infarction: Secondary | ICD-10-CM | POA: Diagnosis not present

## 2019-11-07 DIAGNOSIS — R1312 Dysphagia, oropharyngeal phase: Secondary | ICD-10-CM | POA: Diagnosis not present

## 2019-11-07 DIAGNOSIS — I69391 Dysphagia following cerebral infarction: Secondary | ICD-10-CM | POA: Diagnosis not present

## 2019-11-07 DIAGNOSIS — I69320 Aphasia following cerebral infarction: Secondary | ICD-10-CM | POA: Diagnosis not present

## 2019-11-07 DIAGNOSIS — I69322 Dysarthria following cerebral infarction: Secondary | ICD-10-CM | POA: Diagnosis not present

## 2019-11-08 DIAGNOSIS — R1312 Dysphagia, oropharyngeal phase: Secondary | ICD-10-CM | POA: Diagnosis not present

## 2019-11-08 DIAGNOSIS — I69320 Aphasia following cerebral infarction: Secondary | ICD-10-CM | POA: Diagnosis not present

## 2019-11-08 DIAGNOSIS — I69322 Dysarthria following cerebral infarction: Secondary | ICD-10-CM | POA: Diagnosis not present

## 2019-11-08 DIAGNOSIS — I69391 Dysphagia following cerebral infarction: Secondary | ICD-10-CM | POA: Diagnosis not present

## 2019-11-12 DIAGNOSIS — R1312 Dysphagia, oropharyngeal phase: Secondary | ICD-10-CM | POA: Diagnosis not present

## 2019-11-12 DIAGNOSIS — I69391 Dysphagia following cerebral infarction: Secondary | ICD-10-CM | POA: Diagnosis not present

## 2019-11-12 DIAGNOSIS — I69320 Aphasia following cerebral infarction: Secondary | ICD-10-CM | POA: Diagnosis not present

## 2019-11-12 DIAGNOSIS — I69322 Dysarthria following cerebral infarction: Secondary | ICD-10-CM | POA: Diagnosis not present

## 2019-11-14 DIAGNOSIS — I69391 Dysphagia following cerebral infarction: Secondary | ICD-10-CM | POA: Diagnosis not present

## 2019-11-14 DIAGNOSIS — I69322 Dysarthria following cerebral infarction: Secondary | ICD-10-CM | POA: Diagnosis not present

## 2019-11-14 DIAGNOSIS — R1312 Dysphagia, oropharyngeal phase: Secondary | ICD-10-CM | POA: Diagnosis not present

## 2019-11-14 DIAGNOSIS — I69320 Aphasia following cerebral infarction: Secondary | ICD-10-CM | POA: Diagnosis not present

## 2019-11-19 ENCOUNTER — Telehealth: Payer: Self-pay

## 2019-11-19 NOTE — Telephone Encounter (Signed)
Continued freq alerts for false AF 2 mins, EGM's appear SR w/ ectopy. Implanted for CVA, Routing to Triage for reprogramming to better identify AF events.  Device needs to be reprogrammed in clinic to decrease sensitivity.    Spoke with pt son, he will call next time pt has an appt in the area to schedule something in device clinic around the same time.  Pt lives far and is difficult to get in for appts.

## 2019-11-20 ENCOUNTER — Ambulatory Visit: Payer: Medicare Other | Admitting: Adult Health

## 2019-11-20 ENCOUNTER — Encounter: Payer: Self-pay | Admitting: Adult Health

## 2019-11-20 VITALS — BP 127/83 | HR 69 | Ht 68.0 in | Wt 123.8 lb

## 2019-11-20 DIAGNOSIS — E785 Hyperlipidemia, unspecified: Secondary | ICD-10-CM

## 2019-11-20 DIAGNOSIS — I10 Essential (primary) hypertension: Secondary | ICD-10-CM

## 2019-11-20 DIAGNOSIS — R251 Tremor, unspecified: Secondary | ICD-10-CM

## 2019-11-20 DIAGNOSIS — F0150 Vascular dementia without behavioral disturbance: Secondary | ICD-10-CM

## 2019-11-20 DIAGNOSIS — G309 Alzheimer's disease, unspecified: Secondary | ICD-10-CM | POA: Diagnosis not present

## 2019-11-20 DIAGNOSIS — I63412 Cerebral infarction due to embolism of left middle cerebral artery: Secondary | ICD-10-CM | POA: Diagnosis not present

## 2019-11-20 DIAGNOSIS — F0280 Dementia in other diseases classified elsewhere without behavioral disturbance: Secondary | ICD-10-CM

## 2019-11-20 NOTE — Progress Notes (Signed)
[facility] [address] [place]. [place] [address] [phone]       HOSPITAL FOLLOW UP NOTE  Summer [name] Date of Birth:  Nov 17, 1934 Medical Record Number:  [id number]   Reason for Referral:  hospital stroke follow up    SUBJECTIVE:   CHIEF COMPLAINT:  Chief Complaint  Patient presents with  . Hospitalization Follow-up    Per family, she has doing ok since being home  . room 9    with son and daughter in law    HPI:    Personally reviewed recent hospitalization pertinent notes, imaging and lab work with summary provided below Summer [name] a 85 y.o.femalewith history of mild dementia and hypertensionwho presented on 7/[name]/2021 with acute onset aphasia. Stroke work-up revealed left MCA territory infarct in setting of left M3 branch occlusion of the superior division of left MCA, embolic secondary to unknown source was suspicion for A. fib.  Underwent cerebral angiogram by Dr. Estanislado Pandy without intervention.  Loop recorder placed to assess for atrial fibrillation as potential stroke etiology.  Recommended DAPT for 3 weeks and Plavix alone.  History of HTN with continued home dose Zestril and recommended long-term BP goal normotensive range.  LDL 96 and initiated atorvastatin 40 mg daily.  No history or evidence of DM with A1c 5.3.  Other stroke risk factors include advanced age and prior stroke on imaging.  Other active problems include HOH, mild dementia with confusion/agitation requiring short course of Seroquel and CKD.  Residual deficits of moderate expressive aphasia, mild right lower facial asymmetry, diminished fine finger movements on the right and dysphagia.  Evaluated by therapy and recommended discharge home with [facility] therapy and 24-hour supervision.  Stroke: left MCA territory infarct - embolic - source unknown, suspicious for AF  Resultant aphasia  Code Stroke CT Head- No acute infarct or hemorrhage. ASPECTS is 10. Stable calcified  meningioma total right para clinoid region and left tentorium.   MRI head-Approximately 3 cm infarct in the left inferior frontal gyrus and operculum with no hemorrhage or it can be repeated for me at the cactus had good. 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  CTA H&N- LM3 branch occlusion of the superior division left MCA.   CT Perfusion- noinfarct or penumbra daily ample and you can you keep there and good  Cerebral angio distal posterior parasylvian branch M4 occlusion. No intervention.  2D Echo - EF 55 - 60%. No cardiac source of emboli identified.   Summer [name] - negative  Loopplacement and does relax acts reflexive symptomatic basilar weakness fatigue mid respiratory return next week.  See all the included use with refill ([facility])  LDL -96  HgbA1c-5.3  aspirin 81 mg dailyprior to admission, now on aspirin 81 mgAnd plavix 75 mgdailyx 3 weeks and then plavix alone.  Therapy recommendations: HH PT,OT,HH SLP  Disposition: return home - lives alone PTA, son next door, plans24h care at d/c at her home  Today, 11/20/2019, Summer [name] is being seen for hospital follow-up accompanied by her son and daughter-in-law.   Residual deficits of worsening baseline cognitive impairment, expressive aphasia, and worsening baseline tremors Home health therapies released last week She continues to live in her own home but continues to receive 24-hour supervision due to cognitive impairment and safety concerns Denies new or worsening stroke/TIA symptoms since hospital discharge  Son reports prior history of bilateral upper extremity and left leg tremors but worsened post stroke.  Tremors  can be present at rest but also interfere with ADLs such as eating.  Tremors are intermittent and possibly worsened with increased anxiety.  No prior diagnosis of Parkinson's or essential tremors or family history.  Previously diagnosed with late onset  Alzheimer's being followed by [facility] neurology but reports worsening since most recent stroke with increased confusion and short-term memory concerns.  No behavioral concerns noted.  Remains on Aricept 10 mg daily.  Previously intolerant to Namenda or increased dose of Aricept.  Family requests our office taking over memory monitoring and management  Completed 3 weeks DAPT and remains on Plavix alone without bleeding or bruising.  Continues on atorvastatin with myalgias.  Blood pressure today 127/83. Loop recorder has not shown atrial fibrillation thus far  No further concerns    ROS:   14 system review of systems performed and negative with exception of see HPI  PMH: No past medical history on file.  PSH:  Past Surgical History:  Procedure Laterality Date  . IR ANGIO INTRA EXTRACRAN SEL COM CAROTID INNOMINATE UNI L MOD SED  7/[name]/2021  . LOOP RECORDER INSERTION N/A 10/16/2019   Procedure: LOOP RECORDER INSERTION;  Surgeon: Thompson Grayer, MD;  Location: [facility] [facility];  Service: Cardiovascular;  Laterality: N/A;  . RADIOLOGY WITH ANESTHESIA N/A 7/[name]/2021   Procedure: IR WITH ANESTHESIA;  Surgeon: Luanne Bras, MD;  Location: [facility];  Service: Radiology;  Laterality: N/A;    Social History:  Social History   Socioeconomic History  . Marital status: Widowed    Spouse name: Not on file  . Number of children: Not on file  . Years of education: Not on file  . Highest education level: Not on file  Occupational History  . Not on file  Tobacco Use  . Smoking status: Never Smoker  . Smokeless tobacco: Never Used  . Tobacco comment: Per son  Vaping Use  . Vaping Use: Never used  Substance and Sexual Activity  . Alcohol use: Never  . Drug use: Never  . Sexual activity: Not on file  Other Topics Concern  . Not on file  Social History Narrative  . Not on file   Social Determinants of Health   Financial Resource Strain:   . Difficulty of Paying Living Expenses:   Food  Insecurity:   . Worried About Charity fundraiser in the Last Year:   . Arboriculturist in the Last Year:   Transportation Needs:   . Film/video editor (Medical):   Marland Kitchen Lack of Transportation (Non-Medical):   Physical Activity:   . Days of Exercise per Week:   . Minutes of Exercise per Session:   Stress:   . Feeling of Stress :   Social Connections:   . Frequency of Communication with Friends and Family:   . Frequency of Social Gatherings with Friends and Family:   . Attends Religious Services:   . Active Member of Clubs or Organizations:   . Attends Archivist Meetings:   Marland Kitchen Marital Status:   Intimate Partner Violence:   . Fear of Current or Ex-Partner:   . Emotionally Abused:   Marland Kitchen Physically Abused:   . Sexually Abused:     Family History: No family history on file.  Medications:   Current Outpatient Medications on File Prior to Visit  Medication Sig Dispense Refill  . atorvastatin (LIPITOR) 40 MG tablet Take 1 tablet (40 mg total) by mouth daily. 30 tablet [name]  . Cholecalciferol 25  MCG (1000 UT) tablet Take 1,000 Units by mouth daily.    . citalopram (CELEXA) 20 MG tablet Take 20 mg by mouth daily.    . clopidogrel (PLAVIX) 75 MG tablet Take 1 tablet (75 mg total) by mouth daily. 30 tablet [name]  . Coenzyme Q10 (COQ10 PO) Take 1 tablet by mouth daily.    Marland Kitchen donepezil (ARICEPT) 10 MG tablet Take 10 mg by mouth at bedtime.     Marland Kitchen lisinopril (ZESTRIL) 10 MG tablet Take 10 mg by mouth daily.    Marland Kitchen ROCKLATAN 0.02-0.005 % SOLN Place 1 drop into both eyes every evening.     No current facility-administered medications on file prior to visit.    Allergies:  No Known Allergies    OBJECTIVE:  Physical Exam  Vitals:   11/20/19 0913  BP: 127/83  Pulse: 69  Weight: 123 lb 12.8 oz (56.[name] kg)  Height: 5\' 8"  (1.727 m)   Body mass index is 18.82 kg/m. No exam data present  No flowsheet data found.   General: Frail very pleasant elderly Caucasian female, seated, in no  evident distress   Neck: supple with no carotid or supraclavicular bruits Cardiovascular: regular rate and rhythm, no murmurs Vascular:  Normal pulses all extremities   Neurologic Exam Mental Status: Awake and fully alert.   Mild expressive aphasia with mild dysarthria.  Mild vocal tremor.  Oriented to place and time. Recent memory poor and remote memory intact. Attention span, concentration and fund of knowledge impaired with history provided by family. Mood and affect appropriate.  Cranial Nerves: Fundoscopic exam reveals sharp disc margins. Pupils equal, briskly reactive to light. Extraocular movements full without nystagmus. Visual fields full to confrontation.  HOH bilaterally. Facial sensation intact. Face, tongue, palate moves normally and symmetrically.  Motor: Normal bulk and tone. Normal strength in all tested extremity muscles. Sensory.: intact to touch , pinprick , position and vibratory sensation.  Coordination: Rapid alternating movements normal in all extremities. Finger-to-nose ataxia R>L and heel-to-shin mild ataxia right leg.  Intermittent moderate resting tremor R>L, mild intention tremor R>L.  No evidence of cogwheel rigidity or bradykinesia. Gait and Station: Arises from chair with mild difficulty. Stance is normal. Gait demonstrates  short shuffled gait with normal balance and no assistive device Reflexes: 1+ and symmetric. Toes downgoing.     NIHSS  [name] Modified Rankin  3      ASSESSMENT: KAMIRAH SHUGRUE is a 85 y.o. year old female presented with acute onset aphasia on 7-21 with stroke work-up revealing left MCA territory infarct, embolic secondary to unknown source with suspicion of A. fib therefore loop recorder placed. Vascular risk factors include prior stroke on imaging (L cerebellum), HTN, HLD and mild dementia.      PLAN:  1. Left MCA stroke:  a. Residual deficit: Mild expressive aphasia and dysarthria, worsening baseline cognitive impairment and worsening  upper extremity and LLE tremor.  Completed HH therapy and family hesitant for outpatient therapy due to worsening confusion upon leaving her home.  Encouraged continued HEP and to call office if wishes to pursue additional outpatient therapy b.  Loop recorder will continue to be monitored for possible atrial fibrillation.  No evidence of atrial fibrillation thus far.  c.  Continue clopidogrel 75 mg daily  and atorvastatin for secondary stroke prevention.  d. Close PCP follow up for aggressive stroke risk factor management  [name]. Tremors, unspecified a. ddx parkinsonian vs cerebellar tremor vs essential tremor vs idiopathic b. Underlying history worsened with  recent stroke.  Evidence of old left cerebellar stroke new from 2018 c. Intermittent resting tremor R>L, intention tremor, voice tremor, shuffling type gait.  No evidence of bradykinesia or cogwheel rigidity. d. Recommend ongoing monitoring and hesitant to initiate medications for tremors in a frail 85 year old female due to potential side effects outweighing the benefit. e. May consider evaluation by movement specialist Dr. Rexene Alberts in the future if needed 3. Dementia:  a. Underlying cognitive impairment worsened post stroke b. Possibly mixed Alzheimer's type and vascular c. Will obtain MMSE at follow-up visit d. Remain on Aricept 10 mg daily.  Prior intolerance to higher dose and Namenda 4. HLD:  a. LDL goal <70. Recent LDL 96.  b. Continue atorvastatin c. F/u with PCP for management as well as prescribing of statin 5. HTN a. BP goal<130/90 b. Stable c. Managed by PCP    Follow up in 3 months or call earlier if needed   I spent 60 minutes of face-to-face and non-face-to-face time with patient and family.  This included previsit chart review, lab review, study review, order entry, electronic health record documentation, patient education regarding recent stroke, residual deficits, underlying tremors and possible etiology, history of  dementia, importance of managing stroke risk factors and answered all questions to patient satisfaction     Frann Rider, [facility]  [facility] [name] [place], [place] [address]  Phone [phone] Fax [phone] Note: This document was prepared with digital dictation and possible smart phrase technology. Any transcriptional errors that result from this process are unintentional.

## 2019-11-20 NOTE — Patient Instructions (Signed)
Recommend memory exercises such as cross word puzzles, word search, card games and reading which can help with memory  We will repeat a memory test at the next follow up visit - continue on aricept for now but may consider medication changes in the future  Tremors likely due to prior cerebellar stroke which can cause tremors type movements  Continue clopidogrel 75 mg daily  and atorvastatin 40 mg daily for secondary stroke prevention  Loop recorder will continue to be monitored for possible atrial fibrillation  Continue to follow with [facility] neurology for dementia monitoring and management  Continue to follow up with PCP regarding cholesterol and blood pressure management  Maintain strict control of hypertension with blood pressure goal below 130/90, diabetes with hemoglobin A1c goal below 6.5% and cholesterol with LDL cholesterol (bad cholesterol) goal below 70 mg/dL.        Followup in the future with me in 3 months or call earlier if needed       Thank you for coming to see [place] at [facility]. I hope we have been able to provide you high quality care today.  You may receive a patient satisfaction survey over the next few weeks. We would appreciate your feedback and comments so that we may continue to improve ourselves and the health of our patients.

## 2019-11-22 NOTE — Progress Notes (Signed)
I agree with the above plan 

## 2019-11-26 DIAGNOSIS — M6281 Muscle weakness (generalized): Secondary | ICD-10-CM | POA: Diagnosis not present

## 2019-11-26 DIAGNOSIS — I639 Cerebral infarction, unspecified: Secondary | ICD-10-CM | POA: Diagnosis not present

## 2019-12-03 ENCOUNTER — Ambulatory Visit (INDEPENDENT_AMBULATORY_CARE_PROVIDER_SITE_OTHER): Payer: Medicare Other | Admitting: *Deleted

## 2019-12-03 DIAGNOSIS — I639 Cerebral infarction, unspecified: Secondary | ICD-10-CM | POA: Diagnosis not present

## 2019-12-03 LAB — CUP PACEART REMOTE DEVICE CHECK
Date Time Interrogation Session: 20210822184624
Implantable Pulse Generator Implant Date: 20210706

## 2019-12-06 NOTE — Progress Notes (Signed)
Carelink Summary Report / Loop Recorder 

## 2019-12-12 ENCOUNTER — Telehealth: Payer: Self-pay | Admitting: Emergency Medicine

## 2019-12-12 NOTE — Telephone Encounter (Signed)
Patient's son Merry Proud( per [facility]) notified that loop recorder reprogramming was done remotely and his mothers appointment will be cancelled for 12/13/19.

## 2019-12-12 NOTE — Telephone Encounter (Signed)
AF sensativity remotely programmed from balanced to less sensitive due to false AF episodes.

## 2019-12-31 ENCOUNTER — Telehealth: Payer: Self-pay | Admitting: *Deleted

## 2019-12-31 NOTE — Telephone Encounter (Signed)
LINQ alert received for AF episodes on LINQ.  Implanted for cryptogenic stroke, on clopidogrel 75mg  daily.  Reviewed ECGs with Dr. Lovena Le, strips most likely indicate ST w/ frequent PACs.  Dr. Lovena Le also recommended review by Dr. Rayann Heman.  Forwarded for review.

## 2020-01-02 NOTE — Telephone Encounter (Signed)
Per Dr. Jackalyn Lombard review of ECGs, episodes exhibit "disorganized atrial activity and sinus rhythm with nonsustained A-tach/frequent PACs.  No clear A-fib.  Continue to monitor."

## 2020-01-05 LAB — CUP PACEART REMOTE DEVICE CHECK
Date Time Interrogation Session: 20210925000114
Implantable Pulse Generator Implant Date: 20210706

## 2020-01-07 ENCOUNTER — Telehealth: Payer: Self-pay | Admitting: Student

## 2020-01-07 ENCOUNTER — Ambulatory Visit (INDEPENDENT_AMBULATORY_CARE_PROVIDER_SITE_OTHER): Payer: Medicare Other | Admitting: Emergency Medicine

## 2020-01-07 DIAGNOSIS — I639 Cerebral infarction, unspecified: Secondary | ICD-10-CM

## 2020-01-07 NOTE — Telephone Encounter (Signed)
  Pt episodes previously reviewed and determined atrial /sinus tach with PACs.  With increased episode length will forward to MD review for consideration of medication adjustment or device reprogramming.   P waves apparent at times, but at others difficult to appreciate.  (Implanted for cryptogenic stroke)  Legrand Como "Oda Kilts, [place]  01/07/2020 10:25 AM

## 2020-01-08 NOTE — Progress Notes (Signed)
Carelink Summary Report / Loop Recorder 

## 2020-01-15 NOTE — Telephone Encounter (Signed)
Looks like atrial fib to me. With stroke hx, I would start an [place] or warfarin unless bleeding risk is increased. GT

## 2020-01-16 NOTE — Telephone Encounter (Signed)
Called and spoke with son, Jeff-EC on file, he is agreeable to appt 01/22/20 at 9:30 am with Roderic Palau, NP.  Son states he is unable to bring patient sooner due to his schedule.

## 2020-01-16 NOTE — Telephone Encounter (Signed)
Cryptogenic stroke and AF identified on Loop  Can you all please contact for a visit to discuss and start [place]?  Thank you!  Legrand Como [address]" [place], PA-C  01/16/2020 8:40 AM

## 2020-01-17 ENCOUNTER — Other Ambulatory Visit: Payer: Self-pay

## 2020-01-17 NOTE — Patient Outreach (Signed)
[place] [facility]) Care Management  01/17/2020  Summer [name] [birth year] [phone]  First telephone outreach attempt to obtain mRS. No answer. Left message for returned call.  [facility] Management Assistant [phone]

## 2020-01-17 NOTE — Patient Outreach (Signed)
[place] [facility]) Care Management  01/17/2020  Summer [name] [birth year] [phone]  Telephone outreach to patient to obtain mRS was successfully completed. MRS=3  [place] Management Assistant [phone]

## 2020-01-22 ENCOUNTER — Encounter (HOSPITAL_COMMUNITY): Payer: Self-pay | Admitting: Nurse Practitioner

## 2020-01-22 ENCOUNTER — Other Ambulatory Visit: Payer: Self-pay

## 2020-01-22 ENCOUNTER — Ambulatory Visit (HOSPITAL_COMMUNITY)
Admission: RE | Admit: 2020-01-22 | Discharge: 2020-01-22 | Disposition: A | Discharge status: Home / Self Care | Payer: Medicare Other | Source: Ambulatory Visit | Attending: Nurse Practitioner | Admitting: Nurse Practitioner

## 2020-01-22 VITALS — BP 110/68 | HR 75 | Ht 68.0 in | Wt 129.8 lb

## 2020-01-22 DIAGNOSIS — Z79899 Other long term (current) drug therapy: Secondary | ICD-10-CM | POA: Insufficient documentation

## 2020-01-22 DIAGNOSIS — R55 Syncope and collapse: Secondary | ICD-10-CM | POA: Insufficient documentation

## 2020-01-22 DIAGNOSIS — F0390 Unspecified dementia without behavioral disturbance: Secondary | ICD-10-CM | POA: Diagnosis not present

## 2020-01-22 DIAGNOSIS — I10 Essential (primary) hypertension: Secondary | ICD-10-CM | POA: Diagnosis not present

## 2020-01-22 DIAGNOSIS — I4891 Unspecified atrial fibrillation: Secondary | ICD-10-CM | POA: Insufficient documentation

## 2020-01-22 DIAGNOSIS — Z7902 Long term (current) use of antithrombotics/antiplatelets: Secondary | ICD-10-CM | POA: Diagnosis not present

## 2020-01-22 DIAGNOSIS — Z8673 Personal history of transient ischemic attack (TIA), and cerebral infarction without residual deficits: Secondary | ICD-10-CM | POA: Diagnosis not present

## 2020-01-22 DIAGNOSIS — D6869 Other thrombophilia: Secondary | ICD-10-CM

## 2020-01-22 MED ORDER — APIXABAN 2.5 MG PO TABS
2.5000 mg | ORAL_TABLET | Freq: Two times a day (BID) | ORAL | 2 refills | Status: DC
Start: 2020-01-22 — End: 2020-03-17

## 2020-01-22 NOTE — Progress Notes (Addendum)
Primary Care Physician: Jaclynn Major, NP Referring Physician: Dr. Jennell [facility] clinic Neuro: Dr. Dimple Nanas, NP    Summer [name] is a 85 y.o. female with a h/o mild dementia and hypertensionwho presented on 10/12/2019 with acute onset aphasia. Stroke work-up revealed left MCA territory infarct in setting of left M3 branch occlusion of the superior division of left MCA, embolic secondary to unknown source was suspicion for A. fib.  Underwent cerebral angiogram by Dr. Estanislado Pandy without intervention.  Loop recorder placed to assess for atrial fibrillation as potential stroke etiology.  Recommended DAPT for 3 weeks and Plavix alone.   She  is now in the afib clinic as her Linq recently showed afib and to discuss change in anticoagulation. She is currently on clopidogrel. She is here with her son and daughter in law  today.  The son  reports that she had a syncopal spell after getting out of shower yesterday. A caretaker was with her and was helping to dry her off and get dressed when she was noted to get diaphoretic.  She slumped over on the toilet and was lowered to the ground. As soon as she got flat on the floor she resumed consciousness. The son states that she drinks tea during the day but no water. Her BP/HR was not checked.  Her Linq showed no arhythmia during this time.   Today, she denies symptoms of palpitations, chest pain, shortness of breath, orthopnea, PND, lower extremity edema, dizziness, presyncope, syncope, or neurologic sequela. The patient is tolerating medications without difficulties and is otherwise without complaint today.   No past medical history on file. Past Surgical History:  Procedure Laterality Date  . IR ANGIO INTRA EXTRACRAN SEL COM CAROTID INNOMINATE UNI L MOD SED  10/12/2019  . LOOP RECORDER INSERTION N/A 10/16/2019   Procedure: LOOP RECORDER INSERTION;  Surgeon: Thompson Grayer, MD;  Location: [facility];  Service: Cardiovascular;  Laterality:  N/A;  . RADIOLOGY WITH ANESTHESIA N/A 10/12/2019   Procedure: IR WITH ANESTHESIA;  Surgeon: Luanne Bras, MD;  Location: [facility];  Service: Radiology;  Laterality: N/A;    Current Outpatient Medications  Medication Sig Dispense Refill  . atorvastatin (LIPITOR) 40 MG tablet Take 1 tablet (40 mg total) by mouth daily. 30 tablet 2  . citalopram (CELEXA) 20 MG tablet Take 20 mg by mouth daily.    . clopidogrel (PLAVIX) 75 MG tablet Take 1 tablet (75 mg total) by mouth daily. 30 tablet 2  . Coenzyme Q10 (COQ10) 200 MG CAPS Take 1 tablet by mouth daily.     Marland Kitchen donepezil (ARICEPT) 10 MG tablet Take 10 mg by mouth at bedtime.     Marland Kitchen lisinopril (ZESTRIL) 10 MG tablet Take 10 mg by mouth daily.    Marland Kitchen ROCKLATAN 0.02-0.005 % SOLN Place 1 drop into both eyes every evening.    . Cholecalciferol 25 MCG (1000 UT) tablet Take 1,000 Units by mouth daily.     No current facility-administered medications for this encounter.    No Known Allergies  Social History   Socioeconomic History  . Marital status: Widowed    Spouse name: Not on file  . Number of children: Not on file  . Years of education: Not on file  . Highest education level: Not on file  Occupational History  . Not on file  Tobacco Use  . Smoking status: Never Smoker  . Smokeless tobacco: Never Used  . Tobacco comment: Per son  Vaping Use  .  Vaping Use: Never used  Substance and Sexual Activity  . Alcohol use: Never  . Drug use: Never  . Sexual activity: Not on file  Other Topics Concern  . Not on file  Social History Narrative  . Not on file   Social Determinants of Health   Financial Resource Strain:   . Difficulty of Paying Living Expenses: Not on file  Food Insecurity:   . Worried About Charity fundraiser in the Last Year: Not on file  . Ran Out of Food in the Last Year: Not on file  Transportation Needs:   . Lack of Transportation (Medical): Not on file  . Lack of Transportation (Non-Medical): Not on file  Physical  Activity:   . Days of Exercise per Week: Not on file  . Minutes of Exercise per Session: Not on file  Stress:   . Feeling of Stress : Not on file  Social Connections:   . Frequency of Communication with Friends and Family: Not on file  . Frequency of Social Gatherings with Friends and Family: Not on file  . Attends Religious Services: Not on file  . Active Member of Clubs or Organizations: Not on file  . Attends Archivist Meetings: Not on file  . Marital Status: Not on file  Intimate Partner Violence:   . Fear of Current or Ex-Partner: Not on file  . Emotionally Abused: Not on file  . Physically Abused: Not on file  . Sexually Abused: Not on file    No family history on file.  ROS- All systems are reviewed and negative except as per the HPI above  Physical Exam: Vitals:   01/22/20 0925  BP: 110/68  Weight: 58.9 kg  Height: 5\' 8"  (1.727 m)   Wt Readings from Last 3 Encounters:  01/22/20 58.9 kg  11/20/19 56.2 kg  10/12/19 64.1 kg    Labs: Lab Results  Component Value Date   NA 139 10/15/2019   K 3.7 10/15/2019   CL 105 10/15/2019   CO2 23 10/15/2019   GLUCOSE 86 10/15/2019   BUN 18 10/15/2019   CREATININE 1.29 (H) 10/15/2019   CALCIUM 8.8 (L) 10/15/2019   MG 1.8 10/15/2019   Lab Results  Component Value Date   INR 1.0 10/12/2019   Lab Results  Component Value Date   CHOL 158 10/13/2019   HDL 49 10/13/2019   LDLCALC 96 10/13/2019   TRIG 63 10/13/2019     GEN- The patient is well appearing, alert and oriented x 3 today.   Head- normocephalic, atraumatic Eyes-  Sclera clear, conjunctiva pink Ears- hearing intact Oropharynx- clear Neck- supple, no JVP Lymph- no cervical lymphadenopathy Lungs- Clear to ausculation bilaterally, normal work of breathing Heart- Regular rate and rhythm, no murmurs, rubs or gallops, PMI not laterally displaced GI- soft, NT, ND, + BS Extremities- no clubbing, cyanosis, or edema MS- no significant deformity or  atrophy Skin- no rash or lesion Psych- euthymic mood, full affect Neuro- strength and sensation are intact  EKG-Sinus rhythm at 75 bpm, pr int 154 ms, qrs int 80 ms, qtc 446 ms  Linq reviewed Looks like atrial fib to me. With stroke hx, I would start an [place] or warfarin unless bleeding risk is increased. GT    January 07, 2020     10:26 AM Shirley Friar, PA-C routed this conversation to Evans Lance, MD   Shirley Friar, PA-C     10:26 AM Note    Pt  episodes previously reviewed and determined atrial /sinus tach with PACs.  With increased episode length will forward to MD review for consideration of medication adjustment or device reprogramming.   P waves apparent at times, but at others difficult to appreciate.  (Implanted for cryptogenic stroke)  Legrand Como "Oda Kilts, [place]  01/07/2020 10:25 AM         Assessment and Plan: 1. New onset afib  In SR today  Noted recently on Linq Discussed with family members (pt has dementia) re afib and risk of stroke  Will stop clopidogrel and start  eliquis 2.5 mg bid (age over 65 and weight less than 60 kg)  I will give 30 day free card  Labs bmet/cbc reviewed  Bleeding precautions discussed   2. Brief LOC  Occurred  right after shower yesterday,preceded with diaphoresis  Arrhythmia not noted during that time on Linq I question a brief drop in BP Pt has done this  remotely in the past  Encouraged increased water intake  If happens again, report to PCP and try to get V/S at time of episode   I will see back in one month   Butch Penny C. Shadd Dunstan, [facility] [address] [place], [place] [address] [phone]

## 2020-01-22 NOTE — Patient Instructions (Signed)
Stop plavix  Start Eliquis 2.5mg twice a day 

## 2020-02-07 ENCOUNTER — Ambulatory Visit (INDEPENDENT_AMBULATORY_CARE_PROVIDER_SITE_OTHER): Payer: Medicare Other

## 2020-02-07 DIAGNOSIS — I639 Cerebral infarction, unspecified: Secondary | ICD-10-CM

## 2020-02-07 LAB — CUP PACEART REMOTE DEVICE CHECK
Date Time Interrogation Session: 20211027184343
Date Time Interrogation Session: 20211028000219
Implantable Pulse Generator Implant Date: 20210706
Implantable Pulse Generator Implant Date: 20210706

## 2020-02-12 NOTE — Progress Notes (Signed)
Carelink Summary Report / Loop Recorder 

## 2020-02-22 ENCOUNTER — Other Ambulatory Visit: Payer: Self-pay

## 2020-02-22 ENCOUNTER — Encounter (HOSPITAL_COMMUNITY): Payer: Self-pay | Admitting: Nurse Practitioner

## 2020-02-22 ENCOUNTER — Ambulatory Visit (HOSPITAL_COMMUNITY)
Admission: RE | Admit: 2020-02-22 | Discharge: 2020-02-22 | Disposition: A | Discharge status: Home / Self Care | Payer: Medicare Other | Source: Ambulatory Visit | Attending: Nurse Practitioner | Admitting: Nurse Practitioner

## 2020-02-22 VITALS — BP 160/76 | HR 61 | Ht 68.0 in | Wt 132.6 lb

## 2020-02-22 DIAGNOSIS — I10 Essential (primary) hypertension: Secondary | ICD-10-CM | POA: Diagnosis not present

## 2020-02-22 DIAGNOSIS — D6869 Other thrombophilia: Secondary | ICD-10-CM | POA: Diagnosis not present

## 2020-02-22 DIAGNOSIS — I4891 Unspecified atrial fibrillation: Secondary | ICD-10-CM | POA: Diagnosis not present

## 2020-02-22 DIAGNOSIS — Z7901 Long term (current) use of anticoagulants: Secondary | ICD-10-CM | POA: Diagnosis not present

## 2020-02-22 DIAGNOSIS — F0390 Unspecified dementia without behavioral disturbance: Secondary | ICD-10-CM | POA: Diagnosis not present

## 2020-02-22 LAB — BASIC METABOLIC PANEL
Anion gap: 11 (ref 5–15)
BUN: 15 mg/dL (ref 8–23)
CO2: 27 mmol/L (ref 22–32)
Calcium: 9.3 mg/dL (ref 8.9–10.3)
Chloride: 107 mmol/L (ref 98–111)
Creatinine, Ser: 1.29 mg/dL — ABNORMAL HIGH (ref 0.44–1.00)
GFR, Estimated: 41 mL/min — ABNORMAL LOW (ref >60)
Glucose, Bld: 97 mg/dL (ref 70–99)
Potassium: 4.1 mmol/L (ref 3.5–5.1)
Sodium: 145 mmol/L (ref 135–145)

## 2020-02-22 LAB — CBC
HCT: 42.8 % (ref 36.0–46.0)
Hemoglobin: 13 g/dL (ref 12.0–15.0)
MCH: 28.6 pg (ref 26.0–34.0)
MCHC: 30.4 g/dL (ref 30.0–36.0)
MCV: 94.3 fL (ref 80.0–100.0)
Platelets: 209 10*3/uL (ref 150–400)
RBC: 4.54 MIL/uL (ref 3.87–5.11)
RDW: 14.9 % (ref 11.5–15.5)
WBC: 8.1 10*3/uL (ref 4.0–10.5)
nRBC: 0 % (ref 0.0–0.2)

## 2020-02-22 NOTE — Progress Notes (Signed)
Primary Care Physician: Jaclynn Major, NP Referring Physician: Dr. Jennell Corner clinic Neuro: Dr. Dimple Nanas, NP    Summer [name] is a 85 y.o. female with a h/o mild dementia and hypertensionwho presented on 10/12/2019 with acute onset aphasia. Stroke work-up revealed left MCA territory infarct in setting of left M3 branch occlusion of the superior division of left MCA, embolic secondary to unknown source was suspicion for A. fib.  Underwent cerebral angiogram by Dr. Estanislado Pandy without intervention.  Loop recorder placed to assess for atrial fibrillation as potential stroke etiology.  Recommended DAPT for 3 weeks and Plavix alone.   She  is now in the afib clinic as her Linq recently showed afib and to discuss change in anticoagulation. She is currently on clopidogrel. She is here with her son and daughter in law  today.  The son  reports that she had a syncopal spell after getting out of shower yesterday. A caretaker was with her and was helping to dry her off and get dressed when she was noted to get diaphoretic.  She slumped over on the toilet and was lowered to the ground. As soon as she got flat on the floor she resumed consciousness. The son states that she drinks tea during the day but no water. Her BP/HR was not checked.  Her Linq showed no arhythmia during this time.   F/u in afib clinic,02/22/20,  one month after start of anticoagulation with DOAC, after afib was noted on Linq, implanted at time of stroke. Paceart report 10/28 showed  2 ( 20 ) min  episodes of afib. Pt unaware. He had a subconjunctival hemorrhage when she  first started on eliquis and plavix was stopped but still on board in the first 5-7 days. No reoccurrence. No further presyncopal issues. BP elevated in  the office but son states at home in range.   Today, she denies symptoms of palpitations, chest pain, shortness of breath, orthopnea, PND, lower extremity edema, dizziness, presyncope, syncope, or  neurologic sequela. The patient is tolerating medications without difficulties and is otherwise without complaint today.   No past medical history on file. Past Surgical History:  Procedure Laterality Date  . IR ANGIO INTRA EXTRACRAN SEL COM CAROTID INNOMINATE UNI L MOD SED  10/12/2019  . LOOP RECORDER INSERTION N/A 10/16/2019   Procedure: LOOP RECORDER INSERTION;  Surgeon: Thompson Grayer, MD;  Location: [facility];  Service: Cardiovascular;  Laterality: N/A;  . RADIOLOGY WITH ANESTHESIA N/A 10/12/2019   Procedure: IR WITH ANESTHESIA;  Surgeon: Luanne Bras, MD;  Location: [facility];  Service: Radiology;  Laterality: N/A;    Current Outpatient Medications  Medication Sig Dispense Refill  . apixaban (ELIQUIS) 2.5 MG TABS tablet Take 1 tablet (2.5 mg total) by mouth 2 (two) times daily. 60 tablet 2  . atorvastatin (LIPITOR) 40 MG tablet Take 1 tablet (40 mg total) by mouth daily. 30 tablet 2  . Cholecalciferol 25 MCG (1000 UT) tablet Take 1,000 Units by mouth daily.    . citalopram (CELEXA) 20 MG tablet Take 20 mg by mouth daily.    . Coenzyme Q10 (COQ10) 200 MG CAPS Take 1 tablet by mouth daily.     Marland Kitchen donepezil (ARICEPT) 10 MG tablet Take 10 mg by mouth at bedtime.     Marland Kitchen lisinopril (ZESTRIL) 10 MG tablet Take 10 mg by mouth daily.    Marland Kitchen ROCKLATAN 0.02-0.005 % SOLN Place 1 drop into both eyes every evening.     No  current facility-administered medications for this encounter.    No Known Allergies  Social History   Socioeconomic History  . Marital status: Widowed    Spouse name: Not on file  . Number of children: Not on file  . Years of education: Not on file  . Highest education level: Not on file  Occupational History  . Not on file  Tobacco Use  . Smoking status: Never Smoker  . Smokeless tobacco: Never Used  . Tobacco comment: Per son  Vaping Use  . Vaping Use: Never used  Substance and Sexual Activity  . Alcohol use: Never  . Drug use: Never  . Sexual activity: Not  on file  Other Topics Concern  . Not on file  Social History Narrative  . Not on file   Social Determinants of Health   Financial Resource Strain:   . Difficulty of Paying Living Expenses: Not on file  Food Insecurity:   . Worried About Charity fundraiser in the Last Year: Not on file  . Ran Out of Food in the Last Year: Not on file  Transportation Needs:   . Lack of Transportation (Medical): Not on file  . Lack of Transportation (Non-Medical): Not on file  Physical Activity:   . Days of Exercise per Week: Not on file  . Minutes of Exercise per Session: Not on file  Stress:   . Feeling of Stress : Not on file  Social Connections:   . Frequency of Communication with Friends and Family: Not on file  . Frequency of Social Gatherings with Friends and Family: Not on file  . Attends Religious Services: Not on file  . Active Member of Clubs or Organizations: Not on file  . Attends Archivist Meetings: Not on file  . Marital Status: Not on file  Intimate Partner Violence:   . Fear of Current or Ex-Partner: Not on file  . Emotionally Abused: Not on file  . Physically Abused: Not on file  . Sexually Abused: Not on file    No family history on file.  ROS- All systems are reviewed and negative except as per the HPI above  Physical Exam: There were no vitals filed for this visit. Wt Readings from Last 3 Encounters:  01/22/20 58.9 kg  11/20/19 56.2 kg  10/12/19 64.1 kg    Labs: Lab Results  Component Value Date   NA 139 10/15/2019   K 3.7 10/15/2019   CL 105 10/15/2019   CO2 23 10/15/2019   GLUCOSE 86 10/15/2019   BUN 18 10/15/2019   CREATININE 1.29 (H) 10/15/2019   CALCIUM 8.8 (L) 10/15/2019   MG 1.8 10/15/2019   Lab Results  Component Value Date   INR 1.0 10/12/2019   Lab Results  Component Value Date   CHOL 158 10/13/2019   HDL 49 10/13/2019   LDLCALC 96 10/13/2019   TRIG 63 10/13/2019     GEN- The patient is well appearing, alert and oriented  x 3 today.   Head- normocephalic, atraumatic Eyes-  Sclera clear, conjunctiva pink Ears- hearing intact Oropharynx- clear Neck- supple, no JVP Lymph- no cervical lymphadenopathy Lungs- Clear to ausculation bilaterally, normal work of breathing Heart- Regular rate and rhythm, no murmurs, rubs or gallops, PMI not laterally displaced GI- soft, NT, ND, + BS Extremities- no clubbing, cyanosis, or edema MS- no significant deformity or atrophy Skin- no rash or lesion Psych- euthymic mood, full affect Neuro- strength and sensation are intact  EKG-Sinus rhythm at 61  bpm, pr int 176 ms, qrs int 72 ms, qtc 406  ms  Linq reviewed   Assessment and Plan: 1. New onset afib  In SR today  Paceart reviewed, still shows 2 short bursts of afib, pt unaware  Continue  monitoring with Linq   2.  CHA2DS2VASc score of 6 Continue  eliquis 2.5 mg bid  Cbc/bmet  3. HTN  Elevated today Son states at home BP's  in range   F/u with usual MD's/device clinic afib clinic as needed   Geroge Baseman. Harry Bark, [facility] [address] [place], [place] [address] [phone]

## 2020-03-11 ENCOUNTER — Encounter: Payer: Self-pay | Admitting: Adult Health

## 2020-03-11 ENCOUNTER — Telehealth: Payer: Self-pay

## 2020-03-11 ENCOUNTER — Ambulatory Visit: Payer: Medicare Other | Admitting: Adult Health

## 2020-03-11 ENCOUNTER — Ambulatory Visit (INDEPENDENT_AMBULATORY_CARE_PROVIDER_SITE_OTHER): Payer: Medicare Other

## 2020-03-11 VITALS — BP 134/73 | HR 71 | Wt 131.0 lb

## 2020-03-11 DIAGNOSIS — I63412 Cerebral infarction due to embolism of left middle cerebral artery: Secondary | ICD-10-CM | POA: Diagnosis not present

## 2020-03-11 DIAGNOSIS — I639 Cerebral infarction, unspecified: Secondary | ICD-10-CM | POA: Diagnosis not present

## 2020-03-11 DIAGNOSIS — F0280 Dementia in other diseases classified elsewhere without behavioral disturbance: Secondary | ICD-10-CM

## 2020-03-11 DIAGNOSIS — R251 Tremor, unspecified: Secondary | ICD-10-CM

## 2020-03-11 DIAGNOSIS — I4891 Unspecified atrial fibrillation: Secondary | ICD-10-CM

## 2020-03-11 DIAGNOSIS — F0150 Vascular dementia without behavioral disturbance: Secondary | ICD-10-CM

## 2020-03-11 DIAGNOSIS — G309 Alzheimer's disease, unspecified: Secondary | ICD-10-CM

## 2020-03-11 LAB — CUP PACEART REMOTE DEVICE CHECK
Date Time Interrogation Session: 20211129184436
Implantable Pulse Generator Implant Date: 20210706

## 2020-03-11 MED ORDER — CEREFOLIN NAC 6-2-600 MG PO TABS: 1.0000 | ORAL_TABLET | Freq: Every day | ORAL | 0 refills | Status: DC

## 2020-03-11 NOTE — Progress Notes (Signed)
I agree with the above plan 

## 2020-03-11 NOTE — Patient Instructions (Signed)
Recommend trialing Cerefolin NAC which can help with dementia and hopefully prevent worsening   May consider use of Exelon patch if no benefit or unable to tolerate Cerefolin although this medication has limited to no side effects  We will also check blood work to assess for reversible causes of memory loss  Continue Eliquis (apixaban) daily  and atorvastatin  for secondary stroke prevention  Continue to follow up with PCP regarding cholesterol and blood pressure management  Maintain strict control of hypertension with blood pressure goal below 130/90 and cholesterol with LDL cholesterol (bad cholesterol) goal below 70 mg/dL.       Followup in the future with me in 6 months or call earlier if needed       Thank you for coming to see [place] at [facility]. I hope we have been able to provide you high quality care today.  You may receive a patient satisfaction survey over the next few weeks. We would appreciate your feedback and comments so that we may continue to improve ourselves and the health of our patients.

## 2020-03-11 NOTE — Progress Notes (Signed)
[facility] [address] [place]. [place] [address] [phone]       STROKE FOLLOW UP NOTE  [name] Date of Birth:  [birth year] Medical Record Number:  [id number]   Reason for Referral: stroke follow up    SUBJECTIVE:   CHIEF COMPLAINT:  Chief Complaint  Patient presents with  . Follow-up    tx rm  . Cerebrovascular Accident    pt here for a stroke f/u.    HPI:   Today, 03/11/2020, Summer [name] returns for 83-month stroke and dementia follow-up.  Stable since prior visit without new stroke/TIA symptoms with residual expressive aphasia.  Cognition worsening per son in regards to short term memory and confusion.  No behavioral concerns.  Remains on Aricept 10 mg daily tolerating without side effects.  MMSE today 3/30.  New onset A. fib demonstrated on ILR 10/28.  CHA2DS2-VASc score 6 therefore Eliquis 2.5 mg twice daily initiated (age and weight).  Has remained on Eliquis without bleeding or bruising.  Remains on atorvastatin without myalgias.  Blood pressure today 134/73.  No further concerns at this time.   History provided for reference purposes only Initial visit 11/20/2019 JM: Summer [name] is being seen for hospital follow-up accompanied by her son and daughter-in-law.  Residual deficits of worsening baseline cognitive impairment, expressive aphasia, and worsening baseline tremors Home health therapies released last week She continues to live in her own home but continues to receive 24-hour supervision due to cognitive impairment and safety concerns Denies new or worsening stroke/TIA symptoms since hospital discharge Son reports prior history of bilateral upper extremity and left leg tremors but worsened post stroke.  Tremors can be present at rest but also interfere with ADLs such as eating.  Tremors are intermittent and possibly worsened with increased anxiety.  No prior diagnosis of Parkinson's or essential tremors or family history. Previously  diagnosed with late onset Alzheimer's being followed by [facility] neurology but reports worsening since most recent stroke with increased confusion and short-term memory concerns.  No behavioral concerns noted.  Remains on Aricept 10 mg daily.  Previously intolerant to Namenda or increased dose of Aricept.  Family requests our office taking over memory monitoring and management Completed 3 weeks DAPT and remains on Plavix alone without bleeding or bruising.  Continues on atorvastatin with myalgias.  Blood pressure today 127/83. Loop recorder has not shown atrial fibrillation thus far No further concerns  Stroke admission 10/12/2019 Personally reviewed recent hospitalization pertinent notes, imaging and lab work with summary provided below Summer Hatler Aumanis a 85 y.o.femalewith history of mild dementia and hypertensionwho presented on 10/12/2019 with acute onset aphasia. Stroke work-up revealed left MCA territory infarct in setting of left M3 branch occlusion of the superior division of left MCA, embolic secondary to unknown source was suspicion for A. fib.  Underwent cerebral angiogram by Dr. Estanislado Pandy without intervention.  Loop recorder placed to assess for atrial fibrillation as potential stroke etiology.  Recommended DAPT for 3 weeks and Plavix alone.  History of HTN with continued home dose Zestril and recommended long-term BP goal normotensive range.  LDL 96 and initiated atorvastatin 40 mg daily.  No history or evidence of DM with A1c 5.3.  Other stroke risk factors include advanced age and prior stroke on imaging.  Other active problems include HOH, mild dementia with confusion/agitation requiring short course of Seroquel and CKD.  Residual deficits of moderate expressive aphasia, mild right lower facial asymmetry, diminished fine finger movements on the right  and dysphagia.  Evaluated by therapy and recommended discharge home with [facility] therapy and 24-hour supervision.  Stroke: left MCA territory  infarct - embolic - source unknown, suspicious for AF  Resultant aphasia  Code Stroke CT Head- No acute infarct or hemorrhage. ASPECTS is 10. Stable calcified meningioma total right para clinoid region and left tentorium.   MRI head-Approximately 3 cm infarct in the left inferior frontal gyrus and operculum with no hemorrhage or it can be repeated for me at the cactus had good. 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  CTA H&N- LM3 branch occlusion of the superior division left MCA.   CT Perfusion- noinfarct or penumbra daily ample and you can you keep there and good  Cerebral angio distal posterior parasylvian branch M4 occlusion. No intervention.  2D Echo - EF 55 - 60%. No cardiac source of emboli identified.   83 - negative  Loopplacement and does relax acts reflexive symptomatic basilar weakness fatigue mid respiratory return next week.  See all the included use with refill ([facility])  LDL -96  HgbA1c-5.3  aspirin 81 mg dailyprior to admission, now on aspirin 81 mgAnd plavix 75 mgdailyx 3 weeks and then plavix alone.  Therapy recommendations: HH PT,OT,HH SLP  Disposition: return home - lives alone PTA, son next door, plans24h care at d/c at her home      ROS:   N/A d/t cognitive impairment  PMH: No past medical history on file.  PSH:  Past Surgical History:  Procedure Laterality Date  . IR ANGIO INTRA EXTRACRAN SEL COM CAROTID INNOMINATE UNI L MOD SED  10/12/2019  . LOOP RECORDER INSERTION N/A 10/16/2019   Procedure: LOOP RECORDER INSERTION;  Surgeon: Thompson Grayer, MD;  Location: [place] [facility];  Service: Cardiovascular;  Laterality: N/A;  . RADIOLOGY WITH ANESTHESIA N/A 10/12/2019   Procedure: IR WITH ANESTHESIA;  Surgeon: Luanne Bras, MD;  Location: [place];  Service: Radiology;  Laterality: N/A;    Social History:  Social History   Socioeconomic History  . Marital status: Widowed     Spouse name: Not on file  . Number of children: Not on file  . Years of education: Not on file  . Highest education level: Not on file  Occupational History  . Not on file  Tobacco Use  . Smoking status: Never Smoker  . Smokeless tobacco: Never Used  . Tobacco comment: Per son  Vaping Use  . Vaping Use: Never used  Substance and Sexual Activity  . Alcohol use: Never  . Drug use: Never  . Sexual activity: Not on file  Other Topics Concern  . Not on file  Social History Narrative  . Not on file   Social Determinants of Health   Financial Resource Strain:   . Difficulty of Paying Living Expenses: Not on file  Food Insecurity:   . Worried About Charity fundraiser in the Last Year: Not on file  . Ran Out of Food in the Last Year: Not on file  Transportation Needs:   . Lack of Transportation (Medical): Not on file  . Lack of Transportation (Non-Medical): Not on file  Physical Activity:   . Days of Exercise per Week: Not on file  . Minutes of Exercise per Session: Not on file  Stress:   . Feeling of Stress : Not on file  Social Connections:   . Frequency of Communication with Friends and Family: Not on file  . Frequency  of Social Gatherings with Friends and Family: Not on file  . Attends Religious Services: Not on file  . Active Member of Clubs or Organizations: Not on file  . Attends Archivist Meetings: Not on file  . Marital Status: Not on file  Intimate Partner Violence:   . Fear of Current or Ex-Partner: Not on file  . Emotionally Abused: Not on file  . Physically Abused: Not on file  . Sexually Abused: Not on file    Family History: No family history on file.  Medications:   Current Outpatient Medications on File Prior to Visit  Medication Sig Dispense Refill  . apixaban (ELIQUIS) 2.5 MG TABS tablet Take 1 tablet (2.5 mg total) by mouth 2 (two) times daily. 60 tablet 2  . atorvastatin (LIPITOR) 40 MG tablet Take 1 tablet (40 mg total) by mouth daily.  30 tablet 2  . Cholecalciferol 25 MCG (1000 UT) tablet Take 1,000 Units by mouth daily.    . Coenzyme Q10 (COQ10) 200 MG CAPS Take 1 tablet by mouth daily.     Marland Kitchen donepezil (ARICEPT) 10 MG tablet Take 10 mg by mouth at bedtime.     Marland Kitchen lisinopril (ZESTRIL) 10 MG tablet Take 10 mg by mouth daily.    Marland Kitchen ROCKLATAN 0.02-0.005 % SOLN Place 1 drop into both eyes every evening.     No current facility-administered medications on file prior to visit.    Allergies:  No Known Allergies    OBJECTIVE:  Physical Exam  Vitals:   03/11/20 0819  BP: 134/73  Pulse: 71  Weight: 131 lb (59.4 kg)   Body mass index is 19.92 kg/m. No exam data present  General: Frail very pleasant elderly Caucasian female, seated, in no evident distress   Neck: supple with no carotid or supraclavicular bruits Cardiovascular: regular rate and rhythm, no murmurs Vascular:  Normal pulses all extremities   Neurologic Exam Mental Status: Awake and fully alert.   Mild expressive aphasia with mild dysarthria.  Mild vocal tremor.  Oriented to place and time. Recent memory poor and remote memory intact. Attention span, concentration and fund of knowledge impaired with history provided by son. Mood and affect appropriate.  MMSE - Mini Mental State Exam 03/11/2020  Not completed: Unable to complete  Orientation to time 0  Orientation to Place 1  Registration 0  Attention/ Calculation 0  Recall 0  Language- name 2 objects 2  Language- repeat 0  Language- follow 3 step command 0  Language- read & follow direction 0  Write a sentence 0  Copy design 0  Total score 3   Cranial Nerves: Pupils equal, briskly reactive to light. Extraocular movements full without nystagmus. Visual fields full to confrontation.  HOH bilaterally. Facial sensation intact. Face, tongue, palate moves normally and symmetrically.  Motor: Normal bulk and tone. Normal strength in all tested extremity muscles. Sensory.: intact to touch , pinprick ,  position and vibratory sensation.  Coordination: Rapid alternating movements normal in all extremities. Finger-to-nose ataxia R>L and heel-to-shin mild ataxia right leg.  Intermittent moderate resting tremor R>L, mild intention tremor R>L.  No evidence of cogwheel rigidity or bradykinesia. Gait and Station: Arises from chair with mild difficulty. Stance is normal. Gait demonstrates  short shuffled gait with mild imbalance and use of rolling walker Reflexes: 1+ and symmetric. Toes downgoing.         ASSESSMENT/PLAN: KEONDRIA SIEVER is a 85 y.o. year old female presented with acute onset aphasia on 7-21  with stroke work-up revealing left MCA territory infarct, embolic secondary to unknown source with suspicion of A. fib therefore loop recorder placed. Vascular risk factors include prior stroke on imaging (L cerebellum), HTN, HLD, mild dementia and new onset atrial fibrillation found on ILR.  Underlying history of late onset Alzheimer's dementia previously followed by [facility] neurology.     1. Left MCA stroke:  a. Residual deficits stable b.  Continue Eliquis (apixaban) daily  and atorvastatin for secondary stroke prevention.  c. Discussed secondary stroke prevention measures and importance of close PCP follow up for aggressive stroke risk factor management  2. New onset A fib a. Demonstrated on ILR 02/07/2020 b. CHA2DS2-VASc score 6 therefore Eliquis 2.5 mg twice daily initiated by cardiology 3. Tremors, unspecified a. Unknown etiology worsened with recent stroke.   b. Stable without worsening -continue to monitor 4. Dementia:  a. Underlying cognitive impairment worsened post stroke b. Possibly mixed Alzheimer's type and vascular c. MMSE today 3/30 with son reporting gradual decline.  No behavioral concerns. d. Recommend trialing Cerefolin NAC and continue Aricept 10 mg daily e. Prior intolerance to higher dose and Namenda f. Obtain dementia panel today 5. HLD:  a. LDL goal <70.  Recent LDL 96. On atorvastatin per PCP 6. HTN a. BP goal<130/90 stable on lisinopril per PCP    Follow up in 6 months or call earlier if needed   I spent 40 minutes of face-to-face and non-face-to-face time with patient and son.  This included previsit chart review, lab review, study review, order entry, electronic health record documentation, patient education regarding prior stroke, residual deficits, history of dementia with completion and review of MMSE and further medication options, importance of managing stroke risk factors and answered all questions to patient and sons satisfaction   Frann Rider, AGNP-BC  [facility] [address] [place], [place] [address]  Phone [phone] Fax [phone] Note: This document was prepared with digital dictation and possible smart phrase technology. Any transcriptional errors that result from this process are unintentional.

## 2020-03-11 NOTE — Telephone Encounter (Signed)
ED precautions given.

## 2020-03-11 NOTE — Telephone Encounter (Signed)
ILR alert received for frequent PVC's/NSVT with runs.   Spoke to son Merry Proud ([place]), states patient has dementia and he will be able to help with questions. Reports patient appears increased lethargy within the past few days, states patient often has this on and off. States patient was evaluated in office today for follow-up from [facility]. States he does not recall patient complaining of shortness of breath, chest pain or any other cardiac complaints. Is compliant with all medications including Eliquis 2.5 mg BID.   Son, Merry Proud states he would like a follow up call in regards to plan.

## 2020-03-12 LAB — DEMENTIA PANEL
Homocysteine: 23.5 umol/L — ABNORMAL HIGH (ref 0.0–21.3)
RPR Ser Ql: NONREACTIVE
TSH: 1.6 u[IU]/mL (ref 0.450–4.500)
Vitamin B-12: 226 pg/mL — ABNORMAL LOW (ref 232–1245)

## 2020-03-15 NOTE — Telephone Encounter (Signed)
Frequent ventricular ectopy noted Please schedule follow-up with Oda Kilts at next available time.  Not urgent.

## 2020-03-17 ENCOUNTER — Telehealth: Payer: Self-pay

## 2020-03-17 ENCOUNTER — Other Ambulatory Visit (HOSPITAL_COMMUNITY): Payer: Self-pay | Admitting: Nurse Practitioner

## 2020-03-17 NOTE — Telephone Encounter (Signed)
Called to follow up with Summer [name] (DPR). Advised a scheduler will call to set up apt. Advised to call with any questions or concerns. Verbalized understanding.

## 2020-03-17 NOTE — Progress Notes (Signed)
Carelink Summary Report / Loop Recorder 

## 2020-03-18 ENCOUNTER — Telehealth: Payer: Self-pay | Admitting: *Deleted

## 2020-03-18 NOTE — Telephone Encounter (Signed)
-----   Message from Frann Rider, NP sent at 03/13/2020  4:47 PM EST ----- Please advise patient/son that recent lab work showed B12 and elevated homocysteine level.  These deficiencies can increase risk of stroke as well as contribute to worsening cognition.  Recommend initiating vitamin B12 1000 mcg daily which can be purchased over-the-counter.  Initiated Cerefolin NAC at recent visit which should help improve homocysteine level.  Plan on repeating lab work at follow-up visit

## 2020-03-18 NOTE — Telephone Encounter (Signed)
Spoke to son and relayed the lab results.  He verbalized understanding.  OTC B 12 1089mcg daily, then cerefolin NAC costly $100 for 30 tabs at [facility].  I relayed that Brand direct mail order is option or can use GoodRX app for discount on generic.  He will look into that and let us know.

## 2020-03-25 DIAGNOSIS — H40033 Anatomical narrow angle, bilateral: Secondary | ICD-10-CM | POA: Diagnosis not present

## 2020-03-25 DIAGNOSIS — H5340 Unspecified visual field defects: Secondary | ICD-10-CM | POA: Diagnosis not present

## 2020-03-25 DIAGNOSIS — H40053 Ocular hypertension, bilateral: Secondary | ICD-10-CM | POA: Diagnosis not present

## 2020-04-02 ENCOUNTER — Ambulatory Visit: Payer: Medicare Other | Admitting: Student

## 2020-04-02 ENCOUNTER — Encounter: Payer: Self-pay | Admitting: Student

## 2020-04-02 ENCOUNTER — Other Ambulatory Visit: Payer: Self-pay

## 2020-04-02 VITALS — BP 172/80 | HR 73 | Ht 68.0 in | Wt 134.0 lb

## 2020-04-02 DIAGNOSIS — D6869 Other thrombophilia: Secondary | ICD-10-CM

## 2020-04-02 DIAGNOSIS — I4891 Unspecified atrial fibrillation: Secondary | ICD-10-CM

## 2020-04-02 DIAGNOSIS — I639 Cerebral infarction, unspecified: Secondary | ICD-10-CM

## 2020-04-02 DIAGNOSIS — I10 Essential (primary) hypertension: Secondary | ICD-10-CM | POA: Diagnosis not present

## 2020-04-02 DIAGNOSIS — H1132 Conjunctival hemorrhage, left eye: Secondary | ICD-10-CM | POA: Diagnosis not present

## 2020-04-02 LAB — CUP PACEART INCLINIC DEVICE CHECK
Date Time Interrogation Session: 20211222100515
Implantable Pulse Generator Implant Date: 20210706

## 2020-04-02 MED ORDER — METOPROLOL TARTRATE 25 MG PO TABS
25.0000 mg | ORAL_TABLET | Freq: Two times a day (BID) | ORAL | 0 refills | Status: DC
Start: 2020-04-02 — End: 2020-07-01

## 2020-04-02 NOTE — Patient Instructions (Addendum)
Medication Instructions:  Your physician has recommended you make the following change in your medication:  -- START Metoprolol Tartrate (Lopressor) 25 mg - Take 1 tablet (25 mg)  by mouth twice daily -- NEW RX SENT *If you need a refill on your cardiac medications before your next appointment, please call your pharmacy*   Follow-Up: At [facility], you and your health needs are our priority.  As part of our continuing mission to provide you with exceptional heart care, we have created designated Provider Care Teams.  These Care Teams include your primary Cardiologist (physician) and Advanced Practice Providers (APPs -  Physician Assistants and Nurse Practitioners) who all work together to provide you with the care you need, when you need it.  We recommend signing up for the patient portal called "MyChart".  Sign up information is provided on this After Visit Summary.  MyChart is used to connect with patients for Virtual Visits (Telemedicine).  Patients are able to view lab/test results, encounter notes, upcoming appointments, etc.  Non-urgent messages can be sent to your provider as well.   To learn more about what you can do with MyChart, go to [email or web address].    Your next appointment:   Your physician recommends that you schedule a follow-up appointment in: 6 WEEKS with Oda Kilts, PA-C. -- Thursday 05/15/20 at 9:40 am  Remote monitoring is used to monitor your Loop Recorder [facility]) from home. This monitoring reduces the number of office visits required to check your device to one time per year. It allows [place] to keep an eye on the functioning of your device to ensure it is working properly. You are scheduled for a device check from home on 04/14/20. You may send your transmission at any time that day. If you have a wireless device, the transmission will be sent automatically. After your physician reviews your transmission, you will receive a postcard with your next transmission  date.

## 2020-04-02 NOTE — Progress Notes (Signed)
Electrophysiology Office Note Date: 04/02/2020  ID:  Nyiema, Skyberg Dec 14, 1934, MRN [id number]  PCP: Jaclynn Major, NP Primary Cardiologist: No primary care provider on file. Electrophysiologist: Thompson Grayer, MD   CC: ILR follow-up  Summer [name] is a 85 y.o. female seen today for Dr. Rayann Heman . she presents today for acute visit due to PVCs/NSVT noted on remote monitoring. Since last being seen in our clinic, the patient reports doing well overall.  she denies chest pain, palpitations, dyspnea, PND, orthopnea, nausea, vomiting, dizziness, syncope, edema, weight gain, or early satiety. She has memory issues. Son who is here with her today is not sure how much of a history she can give, but she has not had any reported lightheadedness, dizziness, syncope, near syncope, or falls.  Device History: Medtronic loop recorder implanted 10/2019 for Cryptogenic Stroke  No past medical history on file. Past Surgical History:  Procedure Laterality Date  . IR ANGIO INTRA EXTRACRAN SEL COM CAROTID INNOMINATE UNI L MOD SED  10/12/2019  . LOOP RECORDER INSERTION N/A 10/16/2019   Procedure: LOOP RECORDER INSERTION;  Surgeon: Thompson Grayer, MD;  Location: [place] [facility];  Service: Cardiovascular;  Laterality: N/A;  . RADIOLOGY WITH ANESTHESIA N/A 10/12/2019   Procedure: IR WITH ANESTHESIA;  Surgeon: Luanne Bras, MD;  Location: [place];  Service: Radiology;  Laterality: N/A;    Current Outpatient Medications  Medication Sig Dispense Refill  . atorvastatin (LIPITOR) 40 MG tablet Take 1 tablet (40 mg total) by mouth daily. 30 tablet 2  . Cholecalciferol 25 MCG (1000 UT) tablet Take 1,000 Units by mouth daily.    . Coenzyme Q10 (COQ10) 200 MG CAPS Take 1 tablet by mouth daily.     Marland Kitchen donepezil (ARICEPT) 10 MG tablet Take 10 mg by mouth at bedtime.     Marland Kitchen ELIQUIS 2.5 MG TABS tablet TAKE ONE TABLET BY MOUTH TWICE DAILY 60 tablet 6  . lisinopril (ZESTRIL) 10 MG tablet Take 10 mg by mouth daily.     . Methylfol-Methylcob-Acetylcyst (CEREFOLIN NAC) 6-2-600 MG TABS Take 1 tablet by mouth daily. 30 tablet 0  . ROCKLATAN 0.02-0.005 % SOLN Place 1 drop into both eyes every evening.    . metoprolol tartrate (LOPRESSOR) 25 MG tablet Take 1 tablet (25 mg total) by mouth 2 (two) times daily. 180 tablet 0   No current facility-administered medications for this visit.    Allergies:   Patient has no known allergies.   Social History: Social History   Socioeconomic History  . Marital status: Widowed    Spouse name: Not on file  . Number of children: Not on file  . Years of education: Not on file  . Highest education level: Not on file  Occupational History  . Not on file  Tobacco Use  . Smoking status: Never Smoker  . Smokeless tobacco: Never Used  . Tobacco comment: Per son  Vaping Use  . Vaping Use: Never used  Substance and Sexual Activity  . Alcohol use: Never  . Drug use: Never  . Sexual activity: Not on file  Other Topics Concern  . Not on file  Social History Narrative  . Not on file   Social Determinants of Health   Financial Resource Strain: Not on file  Food Insecurity: Not on file  Transportation Needs: Not on file  Physical Activity: Not on file  Stress: Not on file  Social Connections: Not on file  Intimate Partner Violence: Not on file  Family History: No family history on file.   Review of Systems: All other systems reviewed and are otherwise negative except as noted above.  Physical Exam: Vitals:   04/02/20 0935  BP: (!) 172/80  Pulse: 73  SpO2: 97%  Weight: 134 lb (60.8 kg)  Height: 5\' 8"  (1.727 m)     GEN- The patient is elderly appearing, alert and oriented x 3 today.   HEENT: normocephalic, atraumatic; sclera clear, left conjunctiva with moderate subconjunctival hemorrhage medially. NOT circumferential; hearing intact; oropharynx clear; neck supple  Lungs- Clear to ausculation bilaterally, normal work of breathing.  No wheezes, rales,  rhonchi Heart- Regular rate and rhythm, no murmurs, rubs or gallops  GI- soft, non-tender, non-distended, bowel sounds present  Extremities- no clubbing, cyanosis, or edema  MS- no significant deformity or atrophy Skin- warm and dry, no rash or lesion; PPM pocket well healed Psych- euthymic mood, full affect Neuro- strength and sensation are intact  PPM Interrogation- reviewed in detail today,  See PACEART report  EKG:  EKG is not ordered today.   Recent Labs: 10/12/2019: ALT 13 10/15/2019: Magnesium 1.8 02/22/2020: BUN 15; Creatinine, Ser 1.29; Hemoglobin 13.0; Platelets 209; Potassium 4.1; Sodium 145 03/11/2020: TSH 1.600   Wt Readings from Last 3 Encounters:  04/02/20 134 lb (60.8 kg)  03/11/20 131 lb (59.4 kg)  02/22/20 132 lb 9.6 oz (60.1 kg)     Other studies Reviewed: Additional studies/ records that were reviewed today include: Previous EP office notes, Previous remote checks, Most recent labwork.   Assessment and Plan:  1. Cryptogenic Stroke s/p Medtronic Loop recorder AF previously identified. Now on Eliquis 2.5 mg BID for CHA2DS2VASC of 6.   Overall burden is low < 1% with data from 10/30/2019 to today. Normal device function See Pace Art report No changes today  2. PVCs/NSVT Frequent by last check, not as often noted on this check.  Will start low dose lopressor 25 mg BID for this as well as her HTN  3. HTN Adding low dose lopressor as above.  4. Subconjunctival hemorrhage Son reports at least 3-4 mild cases since starting on Eliquis. No visual changes.  She has dark redness today on the medial portion of her left eye today, She will be seeing an optometrist immediately after her visit here today.    Current medicines are reviewed at length with the patient today.   The patient does not have concerns regarding her medicines.  The following changes were made today:  none  Labs/ tests ordered today include:  No orders of the defined types were placed in this  encounter.  Disposition:   Follow up with EP APP in 6 weeks to follow up PVC/NSVT burden and BP on BB.   Jacalyn Lefevre, PA-C  04/02/2020 10:13 AM  [facility] [address] [place] [address] [phone] (office) [phone] (fax)

## 2020-04-14 ENCOUNTER — Ambulatory Visit (INDEPENDENT_AMBULATORY_CARE_PROVIDER_SITE_OTHER): Payer: Medicare Other

## 2020-04-14 DIAGNOSIS — I639 Cerebral infarction, unspecified: Secondary | ICD-10-CM | POA: Diagnosis not present

## 2020-04-15 LAB — CUP PACEART REMOTE DEVICE CHECK
Date Time Interrogation Session: 20220101184828
Implantable Pulse Generator Implant Date: 20210706

## 2020-04-25 NOTE — Progress Notes (Signed)
Carelink Summary Report / Loop Recorder 

## 2020-05-12 ENCOUNTER — Other Ambulatory Visit: Payer: Self-pay | Admitting: Adult Health

## 2020-05-15 ENCOUNTER — Encounter: Payer: Self-pay | Admitting: Student

## 2020-05-15 ENCOUNTER — Other Ambulatory Visit: Payer: Self-pay

## 2020-05-15 ENCOUNTER — Ambulatory Visit: Payer: Medicare Other | Admitting: Student

## 2020-05-15 VITALS — BP 150/72 | HR 60 | Ht 66.0 in | Wt 133.4 lb

## 2020-05-15 DIAGNOSIS — I480 Paroxysmal atrial fibrillation: Secondary | ICD-10-CM | POA: Diagnosis not present

## 2020-05-15 DIAGNOSIS — I639 Cerebral infarction, unspecified: Secondary | ICD-10-CM

## 2020-05-15 DIAGNOSIS — D6869 Other thrombophilia: Secondary | ICD-10-CM

## 2020-05-15 DIAGNOSIS — I10 Essential (primary) hypertension: Secondary | ICD-10-CM

## 2020-05-15 LAB — CUP PACEART INCLINIC DEVICE CHECK
Date Time Interrogation Session: 20220203100929
Implantable Pulse Generator Implant Date: 20210706

## 2020-05-15 NOTE — Patient Instructions (Signed)
Medication Instructions:  *If you need a refill on your cardiac medications before your next appointment, please call your pharmacy*  Follow-Up: At [facility], you and your health needs are our priority.  As part of our continuing mission to provide you with exceptional heart care, we have created designated Provider Care Teams.  These Care Teams include your primary Cardiologist (physician) and Advanced Practice Providers (APPs -  Physician Assistants and Nurse Practitioners) who all work together to provide you with the care you need, when you need it.  We recommend signing up for the patient portal called "MyChart".  Sign up information is provided on this After Visit Summary.  MyChart is used to connect with patients for Virtual Visits (Telemedicine).  Patients are able to view lab/test results, encounter notes, upcoming appointments, etc.  Non-urgent messages can be sent to your provider as well.   To learn more about what you can do with MyChart, go to [email or web address].    Your next appointment:   Your physician wants you to follow-up in: 6 MONTHS with Oda Kilts, PA-C. You will receive a reminder letter in the mail two months in advance. If you don't receive a letter, please call our office to schedule the follow-up appointment.  Remote monitoring is used to monitor your Loop Recorder [facility]) from home. This monitoring reduces the number of office visits required to check your device to one time per year. It allows [place] to keep an eye on the functioning of your device to ensure it is working properly. You are scheduled for a device check from home on 05/19/20. You may send your transmission at any time that day. If you have a wireless device, the transmission will be sent automatically. After your physician reviews your transmission, you will receive a postcard with your next transmission date.  The format for your next appointment:   In Person with Legrand Como "Jonni Sanger" Chalmers Cater,  PA-C

## 2020-05-15 NOTE — Progress Notes (Signed)
Electrophysiology Office Note Date: 05/15/2020  ID:  Summer [name] 26-Dec-1934, MRN [id number]  PCP: Jaclynn Major, NP Primary Cardiologist: No primary care provider on file. Electrophysiologist: Thompson Grayer, MD   CC: ILR follow-up  Summer [name] is a 86 y.o. female seen today for Dr. Rayann Heman . she presents today for routine electrophysiology followup.  Since last being seen in our clinic, the patient reports doing very well.  she denies chest pain, palpitations, dyspnea, PND, orthopnea, nausea, vomiting, dizziness, syncope, edema, weight gain, or early satiety.  Device History: Medtronic loop recorder implanted 10/2019 for Cryptogenic Stroke  No past medical history on file. Past Surgical History:  Procedure Laterality Date  . IR ANGIO INTRA EXTRACRAN SEL COM CAROTID INNOMINATE UNI L MOD SED  10/12/2019  . LOOP RECORDER INSERTION N/A 10/16/2019   Procedure: LOOP RECORDER INSERTION;  Surgeon: Thompson Grayer, MD;  Location: [place] [facility];  Service: Cardiovascular;  Laterality: N/A;  . RADIOLOGY WITH ANESTHESIA N/A 10/12/2019   Procedure: IR WITH ANESTHESIA;  Surgeon: Luanne Bras, MD;  Location: [place];  Service: Radiology;  Laterality: N/A;    Current Outpatient Medications  Medication Sig Dispense Refill  . atorvastatin (LIPITOR) 40 MG tablet Take 1 tablet (40 mg total) by mouth daily. 30 tablet 2  . Cholecalciferol (D-3-5) 125 MCG (5000 UT) capsule Take 1,000 Units by mouth daily.    . Coenzyme Q10 (COQ10) 200 MG CAPS Take 1 tablet by mouth daily.     . cyanocobalamin 1000 MCG tablet Take 1,000 mcg by mouth daily.    Marland Kitchen donepezil (ARICEPT) 10 MG tablet Take 10 mg by mouth at bedtime.     Marland Kitchen ELIQUIS 2.5 MG TABS tablet TAKE ONE TABLET BY MOUTH TWICE DAILY 60 tablet 6  . lisinopril (ZESTRIL) 10 MG tablet Take 10 mg by mouth daily.    . Methylfol-Methylcob-Acetylcyst (CEREFOLIN NAC) 6-2-600 MG TABS Take 1 tablet by mouth daily. 30 tablet 0  . metoprolol tartrate  (LOPRESSOR) 25 MG tablet Take 1 tablet (25 mg total) by mouth 2 (two) times daily. 180 tablet 0  . ROCKLATAN 0.02-0.005 % SOLN Place 1 drop into both eyes every evening.     No current facility-administered medications for this visit.    Allergies:   Patient has no known allergies.   Social History: Social History   Socioeconomic History  . Marital status: Widowed    Spouse name: Not on file  . Number of children: Not on file  . Years of education: Not on file  . Highest education level: Not on file  Occupational History  . Not on file  Tobacco Use  . Smoking status: Never Smoker  . Smokeless tobacco: Never Used  . Tobacco comment: Per son  Vaping Use  . Vaping Use: Never used  Substance and Sexual Activity  . Alcohol use: Never  . Drug use: Never  . Sexual activity: Not on file  Other Topics Concern  . Not on file  Social History Narrative  . Not on file   Social Determinants of Health   Financial Resource Strain: Not on file  Food Insecurity: Not on file  Transportation Needs: Not on file  Physical Activity: Not on file  Stress: Not on file  Social Connections: Not on file  Intimate Partner Violence: Not on file    Family History: No family history on file.   Review of Systems: All other systems reviewed and are otherwise negative except as noted above.  Physical Exam: Vitals:   05/15/20 0951  BP: (!) 150/72  Pulse: 60  SpO2: 99%  Weight: 133 lb 6.4 oz (60.5 kg)  Height: 5\' 6"  (1.676 m)     GEN- The patient is well appearing, alert and oriented x 3 today.   HEENT: normocephalic, atraumatic; sclera clear, conjunctiva pink; hearing intact; oropharynx clear; neck supple  Lungs- Clear to ausculation bilaterally, normal work of breathing.  No wheezes, rales, rhonchi Heart- Regular rate and rhythm, no murmurs, rubs or gallops  GI- soft, non-tender, non-distended, bowel sounds present  Extremities- no clubbing, cyanosis, or edema  MS- no significant  deformity or atrophy Skin- warm and dry, no rash or lesion; PPM pocket well healed Psych- euthymic mood, full affect Neuro- strength and sensation are intact  PPM Interrogation- reviewed in detail today,  See PACEART report  EKG:  EKG is not ordered today.  Recent Labs: 10/12/2019: ALT 13 10/15/2019: Magnesium 1.8 02/22/2020: BUN 15; Creatinine, Ser 1.29; Hemoglobin 13.0; Platelets 209; Potassium 4.1; Sodium 145 03/11/2020: TSH 1.600   Wt Readings from Last 3 Encounters:  05/15/20 133 lb 6.4 oz (60.5 kg)  04/02/20 134 lb (60.8 kg)  03/11/20 131 lb (59.4 kg)     Other studies Reviewed: Additional studies/ records that were reviewed today include: Previous EP office notes, Previous remote checks, Most recent labwork.   Assessment and Plan:  1. Cryptogenic Stroke s/p Medtronic Loop recorder Normal device function See Pace Art report No changes today Continue Eliquis 2.5 mg BID with PAF previously identified and CHA2DS2VASC of 6.   2. PVCs/NSVT Continue lopressor 25 mg BID.   3. HTN Continue current medications. Slightly elevated today but more stable at home.   4. Subconjunctival hemorrhage Has had at least 3-4 mild cases since starting Eliquis. Saw Optometrist and did not need to hold Eliquis.  It has resolved with no further issues.   Current medicines are reviewed at length with the patient today.   The patient does not have concerns regarding her medicines.  The following changes were made today:  none  Labs/ tests ordered today include:  No orders of the defined types were placed in this encounter.  Disposition:   Follow up with EP APP in 6 Months. Sooner with any issues.    Jacalyn Lefevre, PA-C  05/15/2020 10:11 AM  [facility] [address] [place] [address] [phone] (office) [phone] (fax)

## 2020-05-16 LAB — CUP PACEART REMOTE DEVICE CHECK
Date Time Interrogation Session: 20220203184352
Implantable Pulse Generator Implant Date: 20210706

## 2020-05-19 ENCOUNTER — Ambulatory Visit (INDEPENDENT_AMBULATORY_CARE_PROVIDER_SITE_OTHER): Payer: Medicare Other

## 2020-05-19 DIAGNOSIS — I639 Cerebral infarction, unspecified: Secondary | ICD-10-CM | POA: Diagnosis not present

## 2020-05-22 NOTE — Progress Notes (Signed)
Carelink Summary Report / Loop Recorder 

## 2020-05-27 ENCOUNTER — Telehealth: Payer: Self-pay | Admitting: Adult Health

## 2020-05-27 NOTE — Telephone Encounter (Signed)
PT's son called and said he has seen a decline in his mom in the last couple of days and would like [place] to call him back to let him know if he needs to bring his mom in. I put a spot on hold for them this Thursday. Please advise.

## 2020-05-27 NOTE — Telephone Encounter (Signed)
Spoke to pts son.  Pt has stated over the last 2-3 wks, noted more confusion, agitation, not sleeping.  On donepezil. Not taking cerefolin (? Cause of issue) stopped this week. appt was saved for them per phone staff, I went ahead and made the appt 0945 05-29-20.  Had not been to pcp.

## 2020-05-29 ENCOUNTER — Telehealth: Payer: Self-pay | Admitting: Adult Health

## 2020-05-29 ENCOUNTER — Other Ambulatory Visit: Payer: Self-pay

## 2020-05-29 ENCOUNTER — Encounter: Payer: Self-pay | Admitting: Adult Health

## 2020-05-29 ENCOUNTER — Ambulatory Visit: Payer: Medicare Other | Admitting: Adult Health

## 2020-05-29 VITALS — BP 153/72 | HR 55 | Ht 62.0 in | Wt 132.0 lb

## 2020-05-29 DIAGNOSIS — G309 Alzheimer's disease, unspecified: Secondary | ICD-10-CM

## 2020-05-29 DIAGNOSIS — R410 Disorientation, unspecified: Secondary | ICD-10-CM | POA: Diagnosis not present

## 2020-05-29 DIAGNOSIS — F0150 Vascular dementia without behavioral disturbance: Secondary | ICD-10-CM

## 2020-05-29 DIAGNOSIS — I63412 Cerebral infarction due to embolism of left middle cerebral artery: Secondary | ICD-10-CM

## 2020-05-29 DIAGNOSIS — F0280 Dementia in other diseases classified elsewhere without behavioral disturbance: Secondary | ICD-10-CM

## 2020-05-29 NOTE — Telephone Encounter (Signed)
Unable to collect adequate sample for UA in clinic - son can bring her to any [facility] to have completed - we will need to fax order once we know where they will be going

## 2020-05-29 NOTE — Telephone Encounter (Signed)
Pt called, to discuss going to another [facility], because the one chosen was located in [place]. Would like a call from the nurse.

## 2020-05-29 NOTE — Progress Notes (Signed)
[facility] [address] [place]. [place] [address] [phone]       STROKE FOLLOW UP NOTE  [name] Date of Birth:  06/09/1934 Medical Record Number:  [id number]   Reason for Referral: stroke follow up    SUBJECTIVE:   CHIEF COMPLAINT:  Chief Complaint  Patient presents with  . Follow-up    TR With son (Summer [name]) Pt confusion has increased and not sleeping well, she was up for 24 hrs per son    HPI:   Today, 05/29/2020, Summer [name] is being seen for acute visit due to recent cognitive changes.  He reports increased confusion starting on Sunday, 2/13, where she is awake all night long and into Monday afternoon.  By Monday evening she started coming down and slept Monday night.  He reports having fluctuation of increased confusion which worsened after taking Cerefolin (which was started 1.5 months ago). This was held for 2 weeks without any additional prolonged confusional events therefore restarted recently and event occurred Sunday.  He has since discontinued the Cerefolin.  She will have occasional confusional episodes but typically only last 1 to 2 hours and will subside after resting.  These are typically accompanied by increased anxiety, worsening tremors, agitation, can't sit still and disoriented.  Only other medication change was metoprolol almost 2 months ago as well as initiation of B12 but otherwise no other medication changes.  Denies any other neurological symptoms such as weakness, numbness/tingling, speech changes, or visual changes.  No recent fall or traumatic event.    History provided for reference purposes only Update 03/11/2020 JM: Summer [name] returns for 52-month stroke and dementia follow-up.  Stable since prior visit without new stroke/TIA symptoms with residual expressive aphasia.  Cognition worsening per son in regards to short term memory and confusion.  No behavioral concerns.  Remains on Aricept 10 mg daily tolerating without side  effects.  MMSE today 3/30.  New onset A. fib demonstrated on ILR 10/28.  CHA2DS2-VASc score 6 therefore Eliquis 2.5 mg twice daily initiated (age and weight).  Has remained on Eliquis without bleeding or bruising.  Remains on atorvastatin without myalgias.  Blood pressure today 134/73.  No further concerns at this time.  Initial visit 11/20/2019 JM: Summer [name] is being seen for hospital follow-up accompanied by her son and daughter-in-law.  Residual deficits of worsening baseline cognitive impairment, expressive aphasia, and worsening baseline tremors Home health therapies released last week She continues to live in her own home but continues to receive 24-hour supervision due to cognitive impairment and safety concerns Denies new or worsening stroke/TIA symptoms since hospital discharge Son reports prior history of bilateral upper extremity and left leg tremors but worsened post stroke.  Tremors can be present at rest but also interfere with ADLs such as eating.  Tremors are intermittent and possibly worsened with increased anxiety.  No prior diagnosis of Parkinson's or essential tremors or family history. Previously diagnosed with late onset Alzheimer's being followed by [facility] neurology but reports worsening since most recent stroke with increased confusion and short-term memory concerns.  No behavioral concerns noted.  Remains on Aricept 10 mg daily.  Previously intolerant to Namenda or increased dose of Aricept.  Family requests our office taking over memory monitoring and management Completed 3 weeks DAPT and remains on Plavix alone without bleeding or bruising.  Continues on atorvastatin with myalgias.  Blood pressure today 127/83. Loop recorder has not shown atrial fibrillation thus far No further concerns  Stroke  admission 10/12/2019 Personally reviewed recent hospitalization pertinent notes, imaging and lab work with summary provided below Summer Wordell Aumanis a 86 y.o.femalewith history  of mild dementia and hypertensionwho presented on 10/12/2019 with acute onset aphasia. Stroke work-up revealed left MCA territory infarct in setting of left M3 branch occlusion of the superior division of left MCA, embolic secondary to unknown source was suspicion for A. fib.  Underwent cerebral angiogram by Dr. Estanislado Pandy without intervention.  Loop recorder placed to assess for atrial fibrillation as potential stroke etiology.  Recommended DAPT for 3 weeks and Plavix alone.  History of HTN with continued home dose Zestril and recommended long-term BP goal normotensive range.  LDL 96 and initiated atorvastatin 40 mg daily.  No history or evidence of DM with A1c 5.3.  Other stroke risk factors include advanced age and prior stroke on imaging.  Other active problems include HOH, mild dementia with confusion/agitation requiring short course of Seroquel and CKD.  Residual deficits of moderate expressive aphasia, mild right lower facial asymmetry, diminished fine finger movements on the right and dysphagia.  Evaluated by therapy and recommended discharge home with [facility] therapy and 24-hour supervision.  Stroke: left MCA territory infarct - embolic - source unknown, suspicious for AF  Resultant aphasia  Code Stroke CT Head- No acute infarct or hemorrhage. ASPECTS is 10. Stable calcified meningioma total right para clinoid region and left tentorium.   MRI head-Approximately 3 cm infarct in the left inferior frontal gyrus and operculum with no hemorrhage or it can be repeated for me at the cactus had good. 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  CTA H&N- LM3 branch occlusion of the superior division left MCA.   CT Perfusion- noinfarct or penumbra daily ample and you can you keep there and good  Cerebral angio distal posterior parasylvian branch M4 occlusion. No intervention.  2D Echo - EF 55 - 60%. No cardiac source of emboli identified.   Lacey Jensen Virus 2 -  negative  Loopplacement and does relax acts reflexive symptomatic basilar weakness fatigue mid respiratory return next week.  See all the included use with refill ([facility])  LDL -96  HgbA1c-5.3  aspirin 81 mg dailyprior to admission, now on aspirin 81 mgAnd plavix 75 mgdailyx 3 weeks and then plavix alone.  Therapy recommendations: HH PT,OT,HH SLP  Disposition: return home - lives alone PTA, son next door, plans24h care at d/c at her home      ROS:   N/A d/t cognitive impairment  PMH: History reviewed. No pertinent past medical history.  PSH:  Past Surgical History:  Procedure Laterality Date  . IR ANGIO INTRA EXTRACRAN SEL COM CAROTID INNOMINATE UNI L MOD SED  10/12/2019  . LOOP RECORDER INSERTION N/A 10/16/2019   Procedure: LOOP RECORDER INSERTION;  Surgeon: Thompson Grayer, MD;  Location: [place] [facility];  Service: Cardiovascular;  Laterality: N/A;  . RADIOLOGY WITH ANESTHESIA N/A 10/12/2019   Procedure: IR WITH ANESTHESIA;  Surgeon: Luanne Bras, MD;  Location: [place];  Service: Radiology;  Laterality: N/A;    Social History:  Social History   Socioeconomic History  . Marital status: Widowed    Spouse name: Not on file  . Number of children: Not on file  . Years of education: Not on file  . Highest education level: Not on file  Occupational History  . Not on file  Tobacco Use  . Smoking status: Never Smoker  . Smokeless tobacco: Never Used  . Tobacco comment:  Per son  Vaping Use  . Vaping Use: Never used  Substance and Sexual Activity  . Alcohol use: Never  . Drug use: Never  . Sexual activity: Not on file  Other Topics Concern  . Not on file  Social History Narrative  . Not on file   Social Determinants of Health   Financial Resource Strain: Not on file  Food Insecurity: Not on file  Transportation Needs: Not on file  Physical Activity: Not on file  Stress: Not on file  Social Connections: Not on file  Intimate Partner Violence:  Not on file    Family History: History reviewed. No pertinent family history.  Medications:   Current Outpatient Medications on File Prior to Visit  Medication Sig Dispense Refill  . atorvastatin (LIPITOR) 40 MG tablet Take 1 tablet (40 mg total) by mouth daily. 30 tablet 2  . Cholecalciferol (D-3-5) 125 MCG (5000 UT) capsule Take 1,000 Units by mouth daily.    . Coenzyme Q10 (COQ10) 200 MG CAPS Take 1 tablet by mouth daily.     . cyanocobalamin 1000 MCG tablet Take 1,000 mcg by mouth daily.    Marland Kitchen donepezil (ARICEPT) 10 MG tablet Take 10 mg by mouth at bedtime.     Marland Kitchen ELIQUIS 2.5 MG TABS tablet TAKE ONE TABLET BY MOUTH TWICE DAILY 60 tablet 6  . lisinopril (ZESTRIL) 10 MG tablet Take 10 mg by mouth daily.    . Methylfol-Methylcob-Acetylcyst (CEREFOLIN NAC) 6-2-600 MG TABS Take 1 tablet by mouth daily. 30 tablet 0  . metoprolol tartrate (LOPRESSOR) 25 MG tablet Take 1 tablet (25 mg total) by mouth 2 (two) times daily. 180 tablet 0  . ROCKLATAN 0.02-0.005 % SOLN Place 1 drop into both eyes every evening.     No current facility-administered medications on file prior to visit.    Allergies:  No Known Allergies    OBJECTIVE:  Physical Exam  Vitals:   05/29/20 0923  BP: (!) 153/72  Pulse: (!) 55  Weight: 132 lb (59.9 kg)  Height: 5\' 2"  (1.575 m)   Body mass index is 24.14 kg/m. No exam data present  General: Frail very pleasant elderly Caucasian female, seated, in no evident distress   Neck: supple with no carotid or supraclavicular bruits Cardiovascular: regular rate and rhythm, no murmurs Vascular:  Normal pulses all extremities   Neurologic Exam Mental Status: Awake and fully alert.  Mild expressive aphasia with mild dysarthria.  Mild vocal tremor.  Disoriented to place and time although able to state the town she lives in and that she is with her son, Summer [name]. Recent memory poor and remote memory intact. Attention span, concentration and fund of knowledge impaired with  history provided by son. Mood and affect appropriate and cooperative with exam. MMSE unable to be completed.  Cranial Nerves: Pupils equal, briskly reactive to light. Extraocular movements full without nystagmus. Visual fields full to confrontation.  HOH bilaterally. Facial sensation intact. Face, tongue, palate moves normally and symmetrically.  Motor: Normal bulk and tone. Normal strength in all tested extremity muscles. Sensory.: intact to touch , pinprick , position and vibratory sensation.  Coordination: Rapid alternating movements normal in all extremities. Finger-to-nose ataxia R>L and heel-to-shin mild ataxia right leg.  Intermittent moderate resting tremor R>L, mild intention tremor R>L.  Mild head tremor.  No evidence of cogwheel rigidity or bradykinesia. Gait and Station: Arises from chair with mild difficulty. Stance is normal. Gait demonstrates  short shuffled gait with use of rolling walker.  Tandem walk and heel toe not attempted Reflexes: 1+ and symmetric. Toes downgoing.         ASSESSMENT/PLAN: Summer [name] is a 86 y.o. year old female with PMHx of L MCA stroke 10/12/2019 secondary to unknown source s/p ILR, hx of L cerebellum stroke per imaging, dx atrial fibrillation per ILR 01/2020 with initiation of Eliquis, chronic tremors, mixed Alzheimer's and vascular dementia, HTN, and HLD     1. Acute confusion: a. Unknown etiology b. Lab work - UA w/ reflex and culture, CBC w/ diff, CMP, ammonia, B12, and homocystine c. If all within normal limits, would recommend proceeding with CT head and EEG to rule out other underlying causes -no indication for emergent need as no focal deficits noted, lack of trauma or injury preceding confusion or evidence of seizure activity or prior history of seizures 2. Dementia, mixed Alzheimer's and vascular a. d/c cerefolin d/t possible side effects b. Continue Aricept 10 mg daily c. Sleep disturbance: Educated on importance of sleep hygiene,  increasing daytime activity, avoiding daytime napping and adequate light exposure throughout the day.  May possibly improve depending on above work-up but if continues, recommend trialing melatonin but if no benefit, may need to further consider low-dose sleep aide  d. May also consider low-dose benzo for fluctuation of confusion with increased anxiety and agitation - this can be further discussed with follow-up of unremarkable and symptoms persist. Did discuss risk versus benefit of these medications e. b12 level 226 - will repeat today f. Homocystine level 23.5 - will repeat today 3. Left MCA stroke:  a.  Continue Eliquis (apixaban) daily  and atorvastatin for secondary stroke prevention.  b. Discussed secondary stroke prevention measures and importance of close PCP follow up for aggressive stroke risk factor management  c. HLD: LDL goal <70. Recent LDL 96. On atorvastatin per PCP d. HTN: BP goal<130/90 stable on lisinopril per PCP 4. A fib a. Demonstrated on ILR 02/07/2020 b. On Eliquis 2.5 mg twice daily for CHA2DS2-VASc score 6 per cardiology    Follow up in 1 month or call earlier if needed   CC:  [facility] provider: Dr. Jillyn Hidden, Geni Bers, NP    I spent 39 minutes of face-to-face and non-face-to-face time with patient and son.  This included previsit chart review, lab review, study review, order entry, electronic health record documentation, patient and son education and discussion regarding recent decline in cognition and confusion and possible etiologies as well as further evaluation history of prior stroke, importance of managing stroke risk factors and answered all other questions to patient and sons satisfaction   Frann Rider, AGNP-BC  [facility] [address] [place], [place] [address]  Phone [phone] Fax [phone] Note: This document was prepared with digital dictation and possible smart phrase technology. Any transcriptional  errors that result from this process are unintentional.

## 2020-05-29 NOTE — Telephone Encounter (Signed)
Spoke to Summer [name], he had many concerns about taking his mother to the [facility] in [place], addressed his concerns, all questions answered.

## 2020-05-29 NOTE — Telephone Encounter (Signed)
I called [facility]

## 2020-05-29 NOTE — Progress Notes (Signed)
I agree with the above plan 

## 2020-05-29 NOTE — Telephone Encounter (Signed)
Called and will check to see if closer [facility] to where it would be easier for pt to go there. Checking with Varney Biles in [facility].

## 2020-05-29 NOTE — Patient Instructions (Addendum)
Your Plan:  We will check lab work today to rule out infection or metabolic causes of your worsening confusion  If all within normal limits, may need to consider brain imaging or EEG to rule out other causes such as new stroke or possible seizures  We may also need to consider trialing a low-dose of a antianxiety medication or sleeping medication     Follow up in 1 month or call earlier if needed      Thank you for coming to see [place] at [facility]. I hope we have been able to provide you high quality care today.  You may receive a patient satisfaction survey over the next few weeks. We would appreciate your feedback and comments so that we may continue to improve ourselves and the health of our patients.

## 2020-05-30 LAB — CBC WITH DIFFERENTIAL/PLATELET
Basophils Absolute: 0.1 10*3/uL (ref 0.0–0.2)
Basos: 1 %
EOS (ABSOLUTE): 0.2 10*3/uL (ref 0.0–0.4)
Eos: 2 %
Hematocrit: 42.6 % (ref 34.0–46.6)
Hemoglobin: 13.6 g/dL (ref 11.1–15.9)
Immature Grans (Abs): 0.1 10*3/uL (ref 0.0–0.1)
Immature Granulocytes: 1 %
Lymphocytes Absolute: 2.6 10*3/uL (ref 0.7–3.1)
Lymphs: 26 %
MCH: 28.3 pg (ref 26.6–33.0)
MCHC: 31.9 g/dL (ref 31.5–35.7)
MCV: 89 fL (ref 79–97)
Monocytes Absolute: 0.9 10*3/uL (ref 0.1–0.9)
Monocytes: 9 %
Neutrophils Absolute: 6.4 10*3/uL (ref 1.4–7.0)
Neutrophils: 61 %
Platelets: 193 10*3/uL (ref 150–450)
RBC: 4.8 x10E6/uL (ref 3.77–5.28)
RDW: 13.5 % (ref 11.7–15.4)
WBC: 10.3 10*3/uL (ref 3.4–10.8)

## 2020-05-30 LAB — AMMONIA: Ammonia: 35 ug/dL (ref 28–135)

## 2020-05-30 LAB — COMPREHENSIVE METABOLIC PANEL
ALT: 28 IU/L (ref 0–32)
AST: 16 IU/L (ref 0–40)
Albumin/Globulin Ratio: 1.9 (ref 1.2–2.2)
Albumin: 4.5 g/dL (ref 3.6–4.6)
Alkaline Phosphatase: 121 IU/L (ref 44–121)
BUN/Creatinine Ratio: 12 (ref 12–28)
BUN: 16 mg/dL (ref 8–27)
Bilirubin Total: 0.5 mg/dL (ref 0.0–1.2)
CO2: 23 mmol/L (ref 20–29)
Calcium: 9.4 mg/dL (ref 8.7–10.3)
Chloride: 105 mmol/L (ref 96–106)
Creatinine, Ser: 1.35 mg/dL — ABNORMAL HIGH (ref 0.57–1.00)
GFR calc Af Amer: 42 mL/min/{1.73_m2} — ABNORMAL LOW (ref >59)
GFR calc non Af Amer: 36 mL/min/{1.73_m2} — ABNORMAL LOW (ref >59)
Globulin, Total: 2.4 g/dL (ref 1.5–4.5)
Glucose: 81 mg/dL (ref 65–99)
Potassium: 3.9 mmol/L (ref 3.5–5.2)
Sodium: 146 mmol/L — ABNORMAL HIGH (ref 134–144)
Total Protein: 6.9 g/dL (ref 6.0–8.5)

## 2020-05-30 LAB — VITAMIN B12: Vitamin B-12: 1268 pg/mL — ABNORMAL HIGH (ref 232–1245)

## 2020-05-30 LAB — HOMOCYSTEINE: Homocysteine: 10.7 umol/L (ref 0.0–21.3)

## 2020-06-02 DIAGNOSIS — R410 Disorientation, unspecified: Secondary | ICD-10-CM | POA: Diagnosis not present

## 2020-06-02 DIAGNOSIS — R142 Eructation: Secondary | ICD-10-CM | POA: Diagnosis not present

## 2020-06-02 NOTE — Telephone Encounter (Signed)
Fax was received and confirmation to [facility] [phone].  See later note relating to getting sample.

## 2020-06-03 NOTE — Telephone Encounter (Signed)
I have not yet received results regarding urine sample.  Would you be able to contact [facility] to follow-up on results? Thank you

## 2020-06-03 NOTE — Telephone Encounter (Signed)
I called jeff, son of pt.  They took pt to pcp yesterday.  UA was done, urine cx was sent to [facility]. Results to [place] by pcp when received on what they will be doing for pt.  FYI

## 2020-06-03 NOTE — Telephone Encounter (Signed)
I called [facility] [phone] Acct # [id number].  Spoke to rep, who stated received urine cx yesterday.  No ua.  I called and LMVM for [facility] about the specimen done on pt.

## 2020-06-03 NOTE — Telephone Encounter (Signed)
Okay thank you

## 2020-06-03 NOTE — Telephone Encounter (Signed)
Will check with Varney Biles in [place] if she is able to find out.

## 2020-06-06 DIAGNOSIS — R410 Disorientation, unspecified: Secondary | ICD-10-CM | POA: Diagnosis not present

## 2020-06-09 NOTE — Telephone Encounter (Signed)
Received urine cx results from PCP. Inbox.

## 2020-06-09 NOTE — Telephone Encounter (Signed)
Was urinalysis completed?

## 2020-06-10 NOTE — Telephone Encounter (Signed)
Called and asked for UA results to be faxed to [place] at [phone] from the pcp [phone].

## 2020-06-10 NOTE — Telephone Encounter (Signed)
Received office note, UA results noted.

## 2020-06-10 NOTE — Telephone Encounter (Signed)
Reviewed office note from PCP  Urinalysis is positive for small blood, protein and trace leukocytes.  Plan to send for culture and sensitivity  Urine culture results mixed urogenital flora   Further treatment plan and intervention is requested to be deferred to PCP

## 2020-06-10 NOTE — Telephone Encounter (Signed)
Called son.  Relayed message per JM/NP.  Per pcp no treatment/infection.  No change from when in last.  Still confused-progression of dementia. I moved appt to 06-25-20 at 0915 due to scheduling conflict with son on 10-28-57.

## 2020-06-23 ENCOUNTER — Ambulatory Visit (INDEPENDENT_AMBULATORY_CARE_PROVIDER_SITE_OTHER): Payer: Medicare Other

## 2020-06-23 DIAGNOSIS — I6602 Occlusion and stenosis of left middle cerebral artery: Secondary | ICD-10-CM

## 2020-06-24 LAB — CUP PACEART REMOTE DEVICE CHECK
Date Time Interrogation Session: 20220308184235
Implantable Pulse Generator Implant Date: 20210706

## 2020-06-25 ENCOUNTER — Encounter: Payer: Self-pay | Admitting: Adult Health

## 2020-06-25 ENCOUNTER — Ambulatory Visit: Payer: Medicare Other | Admitting: Adult Health

## 2020-06-25 VITALS — BP 148/70 | HR 56 | Ht 61.0 in | Wt 136.0 lb

## 2020-06-25 DIAGNOSIS — I63412 Cerebral infarction due to embolism of left middle cerebral artery: Secondary | ICD-10-CM

## 2020-06-25 DIAGNOSIS — G309 Alzheimer's disease, unspecified: Secondary | ICD-10-CM | POA: Diagnosis not present

## 2020-06-25 DIAGNOSIS — F0150 Vascular dementia without behavioral disturbance: Secondary | ICD-10-CM

## 2020-06-25 DIAGNOSIS — F0280 Dementia in other diseases classified elsewhere without behavioral disturbance: Secondary | ICD-10-CM | POA: Diagnosis not present

## 2020-06-25 NOTE — Patient Instructions (Addendum)
Your Plan:   Continue current plan   Will further discuss with Dr. Leonie Man to see if additional work up is needed at this time or any further recommendations      Follow up in 3 months or call earlier if needed     Thank you for coming to see [place] at [facility]. I hope we have been able to provide you high quality care today.  You may receive a patient satisfaction survey over the next few weeks. We would appreciate your feedback and comments so that we may continue to improve ourselves and the health of our patients.

## 2020-06-25 NOTE — Progress Notes (Signed)
[facility] [address] [place]. [place] [address] [phone]       STROKE FOLLOW UP NOTE  Summer [name] Date of Birth:  Jul 10, 1934 Medical Record Number:  [id number]   Reason for Referral: stroke follow up    SUBJECTIVE:   CHIEF COMPLAINT:  Chief Complaint  Patient presents with  . Follow-up    TR with son Merry Proud) Pt son states she has been more confused than she has been lately. Was checked for UTI -Neg    HPI:   Today, 06/25/2020, Summer [name] returns for follow-up regarding prior concerns of recent cognitive changes  Lab work after prior visit showed slight decline of kidney function Cre 1.35 (prior 1.29) other all within normal limits.  Urinalysis completed by PCP which was negative for UTI.  Since prior visit, son reports continued confusional episodes daily - has not really worsened  Has started melatonin which has helped night time sleeping and less fatigue during the day  Confusion comes and goes throughout the day "where am I at" "when am I going home" Can become agitated but no other behavioral concerns  Can be worse mid afternoon and in the evening  Tried cerefolin and namenda - worsened confusion   Compliant on Eliquis 2.5 mg twice daily and atorvastatin 40 mg daily -denies side effects Blood pressure today initially elevated and on recheck 148/70 -monitors at home and typically stable      History provided for reference purposes only Update 05/29/2020 JM: Summer [name] is being seen for acute visit due to recent cognitive changes.  He reports increased confusion starting on Sunday, 2/13, where she is awake all night long and into Monday afternoon.  By Monday evening she started coming down and slept Monday night.  He reports having fluctuation of increased confusion which worsened after taking Cerefolin (which was started 1.5 months ago). This was held for 2 weeks without any additional prolonged confusional events therefore restarted  recently and event occurred Sunday.  He has since discontinued the Cerefolin.  She will have occasional confusional episodes but typically only last 1 to 2 hours and will subside after resting.  These are typically accompanied by increased anxiety, worsening tremors, agitation, can't sit still and disoriented.  Only other medication change was metoprolol almost 2 months ago as well as initiation of B12 but otherwise no other medication changes.  Denies any other neurological symptoms such as weakness, numbness/tingling, speech changes, or visual changes.  No recent fall or traumatic event.  Update 03/11/2020 JM: Summer [name] returns for 73-month stroke and dementia follow-up.  Stable since prior visit without new stroke/TIA symptoms with residual expressive aphasia.  Cognition worsening per son in regards to short term memory and confusion.  No behavioral concerns.  Remains on Aricept 10 mg daily tolerating without side effects.  MMSE today 3/30.  New onset A. fib demonstrated on ILR 10/28.  CHA2DS2-VASc score 6 therefore Eliquis 2.5 mg twice daily initiated (age and weight).  Has remained on Eliquis without bleeding or bruising.  Remains on atorvastatin without myalgias.  Blood pressure today 134/73.  No further concerns at this time.  Initial visit 11/20/2019 JM: Summer [name] is being seen for hospital follow-up accompanied by her son and daughter-in-law.  Residual deficits of worsening baseline cognitive impairment, expressive aphasia, and worsening baseline tremors Home health therapies released last week She continues to live in her own home but continues to receive 24-hour supervision due to cognitive impairment and safety concerns Denies new  or worsening stroke/TIA symptoms since hospital discharge Son reports prior history of bilateral upper extremity and left leg tremors but worsened post stroke.  Tremors can be present at rest but also interfere with ADLs such as eating.  Tremors are intermittent and  possibly worsened with increased anxiety.  No prior diagnosis of Parkinson's or essential tremors or family history. Previously diagnosed with late onset Alzheimer's being followed by [facility] neurology but reports worsening since most recent stroke with increased confusion and short-term memory concerns.  No behavioral concerns noted.  Remains on Aricept 10 mg daily.  Previously intolerant to Namenda or increased dose of Aricept.  Family requests our office taking over memory monitoring and management Completed 3 weeks DAPT and remains on Plavix alone without bleeding or bruising.  Continues on atorvastatin with myalgias.  Blood pressure today 127/83. Loop recorder has not shown atrial fibrillation thus far No further concerns  Stroke admission 10/12/2019 Personally reviewed recent hospitalization pertinent notes, imaging and lab work with summary provided below SummerAndre Gallego [name] a 86 y.o.femalewith history of mild dementia and hypertensionwho presented on 10/12/2019 with acute onset aphasia. Stroke work-up revealed left MCA territory infarct in setting of left M3 branch occlusion of the superior division of left MCA, embolic secondary to unknown source was suspicion for A. fib.  Underwent cerebral angiogram by Dr. Estanislado Pandy without intervention.  Loop recorder placed to assess for atrial fibrillation as potential stroke etiology.  Recommended DAPT for 3 weeks and Plavix alone.  History of HTN with continued home dose Zestril and recommended long-term BP goal normotensive range.  LDL 96 and initiated atorvastatin 40 mg daily.  No history or evidence of DM with A1c 5.3.  Other stroke risk factors include advanced age and prior stroke on imaging.  Other active problems include HOH, mild dementia with confusion/agitation requiring short course of Seroquel and CKD.  Residual deficits of moderate expressive aphasia, mild right lower facial asymmetry, diminished fine finger movements on the right and  dysphagia.  Evaluated by therapy and recommended discharge home with [facility] therapy and 24-hour supervision.  Stroke: left MCA territory infarct - embolic - source unknown, suspicious for AF  Resultant aphasia  Code Stroke CT Head- No acute infarct or hemorrhage. ASPECTS is 10. Stable calcified meningioma total right para clinoid region and left tentorium.   MRI head-Approximately 3 cm infarct in the left inferior frontal gyrus and operculum with no hemorrhage or it can be repeated for me at the cactus had good. 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  CTA H&N- LM3 branch occlusion of the superior division left MCA.   CT Perfusion- noinfarct or penumbra daily ample and you can you keep there and good  Cerebral angio distal posterior parasylvian branch M4 occlusion. No intervention.  2D Echo - EF 55 - 60%. No cardiac source of emboli identified.   Lacey Jensen Virus 2 - negative  Loopplacement and does relax acts reflexive symptomatic basilar weakness fatigue mid respiratory return next week.  See all the included use with refill ([facility])  LDL -96  HgbA1c-5.3  aspirin 81 mg dailyprior to admission, now on aspirin 81 mgAnd plavix 75 mgdailyx 3 weeks and then plavix alone.  Therapy recommendations: HH PT,OT,HH SLP  Disposition: return home - lives alone PTA, son next door, plans24h care at d/c at her home      ROS:   N/A d/t cognitive impairment  PMH: History reviewed. No pertinent past medical history.  PSH:  Past Surgical History:  Procedure Laterality Date  . IR ANGIO INTRA EXTRACRAN SEL COM CAROTID INNOMINATE UNI L MOD SED  10/12/2019  . LOOP RECORDER INSERTION N/A 10/16/2019   Procedure: LOOP RECORDER INSERTION;  Surgeon: Thompson Grayer, MD;  Location: [place] [facility];  Service: Cardiovascular;  Laterality: N/A;  . RADIOLOGY WITH ANESTHESIA N/A 10/12/2019   Procedure: IR WITH ANESTHESIA;  Surgeon: Luanne Bras, MD;   Location: [place];  Service: Radiology;  Laterality: N/A;    Social History:  Social History   Socioeconomic History  . Marital status: Widowed    Spouse name: Not on file  . Number of children: Not on file  . Years of education: Not on file  . Highest education level: Not on file  Occupational History  . Not on file  Tobacco Use  . Smoking status: Never Smoker  . Smokeless tobacco: Never Used  . Tobacco comment: Per son  Vaping Use  . Vaping Use: Never used  Substance and Sexual Activity  . Alcohol use: Never  . Drug use: Never  . Sexual activity: Not on file  Other Topics Concern  . Not on file  Social History Narrative  . Not on file   Social Determinants of Health   Financial Resource Strain: Not on file  Food Insecurity: Not on file  Transportation Needs: Not on file  Physical Activity: Not on file  Stress: Not on file  Social Connections: Not on file  Intimate Partner Violence: Not on file    Family History: History reviewed. No pertinent family history.  Medications:   Current Outpatient Medications on File Prior to Visit  Medication Sig Dispense Refill  . atorvastatin (LIPITOR) 40 MG tablet Take 1 tablet (40 mg total) by mouth daily. 30 tablet 2  . Cholecalciferol (D-3-5) 125 MCG (5000 UT) capsule Take 1,000 Units by mouth daily.    . Coenzyme Q10 (COQ10) 200 MG CAPS Take 1 tablet by mouth daily.     . cyanocobalamin 1000 MCG tablet Take 1,000 mcg by mouth daily.    Marland Kitchen donepezil (ARICEPT) 10 MG tablet Take 10 mg by mouth at bedtime.     Marland Kitchen ELIQUIS 2.5 MG TABS tablet TAKE ONE TABLET BY MOUTH TWICE DAILY 60 tablet 6  . lisinopril (ZESTRIL) 10 MG tablet Take 10 mg by mouth daily.    . melatonin 3 MG TABS tablet Take 3 mg by mouth at bedtime.    . metoprolol tartrate (LOPRESSOR) 25 MG tablet Take 1 tablet (25 mg total) by mouth 2 (two) times daily. 180 tablet 0  . pantoprazole (PROTONIX) 40 MG tablet Take 40 mg by mouth daily.    Marland Kitchen ROCKLATAN 0.02-0.005 % SOLN  Place 1 drop into both eyes every evening.     No current facility-administered medications on file prior to visit.    Allergies:  No Known Allergies    OBJECTIVE:  Physical Exam  Vitals:   06/25/20 0901 06/25/20 0930  BP: (!) 167/75 (!) 148/70  Pulse: (!) 56   Weight: 136 lb (61.7 kg)   Height: 5\' 1"  (1.549 m)    Body mass index is 25.7 kg/m. No exam data present  General: Frail very pleasant elderly Caucasian female, seated, in no evident distress   Neck: supple with no carotid or supraclavicular bruits Cardiovascular: regular rate and rhythm, no murmurs Vascular:  Normal pulses all extremities   Neurologic Exam Mental Status: Awake and fully alert.  Mild expressive aphasia with mild dysarthria.  Mild  vocal tremor.  Disoriented to place and time although able to state the town she lives in and that she is with her son, Merry Proud. Recent memory poor and remote memory intact. Attention span, concentration and fund of knowledge impaired with history provided by son. Mood and affect appropriate and cooperative with exam. MMSE unable to be completed.  Cranial Nerves: Pupils equal, briskly reactive to light. Extraocular movements full without nystagmus. Visual fields full to confrontation.  HOH bilaterally. Facial sensation intact. Face, tongue, palate moves normally and symmetrically.  Motor: Normal bulk and tone. Normal strength in all tested extremity muscles. Sensory.: intact to touch , pinprick , position and vibratory sensation.  Coordination: Rapid alternating movements normal in all extremities. Finger-to-nose ataxia R>L and heel-to-shin mild ataxia right leg.  Intermittent moderate resting tremor R>L, mild intention tremor R>L.  Mild head tremor.  No evidence of cogwheel rigidity or bradykinesia. Gait and Station: Arises from chair with mild difficulty. Stance is normal. Gait demonstrates  short shuffled gait with use of rolling walker.  Tandem walk and heel toe not  attempted Reflexes: 1+ and symmetric. Toes downgoing.         ASSESSMENT/PLAN: Summer [name] is a 86 y.o. year old female with PMHx of L MCA stroke 10/12/2019 secondary to unknown source s/p ILR, hx of L cerebellum stroke per imaging, dx atrial fibrillation per ILR 01/2020 with initiation of Eliquis, chronic tremors, mixed Alzheimer's and vascular dementia, HTN, and HLD     1. Dementia, mixed Alzheimer's and vascular a. Concern of recent increased confusion possibly in setting of gradual decline of dementia i. Lab work slight decline in kidney function otherwise unremarkable ii. Urinalysis negative for UTI iii. Denies depression (negative GDS) or pain b. Symptoms have since stabilized -intermittent confusion typically mid afternoon and evening with occasional agitation but no other behavioral concerns c. Continue Aricept 10 mg daily -prior intolerance to Namenda and Cerefolin d. Continue melatonin 3 mg nightly for benefit of sleep e. Discussed possible further work-up such as brain imaging or EEG - will further discuss indication or need with Dr. Leonie Man as her current treatment plan would likely not change and no focal deficits appreciated on exam 2. Left MCA stroke:  a.  Continue Eliquis (apixaban) daily  and atorvastatin for secondary stroke prevention.  b. Discussed secondary stroke prevention measures and importance of close PCP follow up for aggressive stroke risk factor management  c. HLD: LDL goal <70. On atorvastatin per PCP - needs repeat lipid panel -plans on following up with PCP d. HTN: BP goal<130/90 stable on lisinopril per PCP 3. A fib a. Demonstrated on ILR 02/07/2020 b. On Eliquis 2.5 mg twice daily for CHA2DS2-VASc score 6 per cardiology    Follow up in 3 months or call earlier if needed   CC:  [place] provider: Dr. Jillyn Hidden, Geni Bers, NP    I spent 39 minutes of face-to-face and non-face-to-face time with patient and son.  This included previsit chart  review, lab review, study review, order entry, electronic health record documentation, patient and son education and discussion regarding daytime confusion and possible progressive decline of dementia, review of lab work and discussion regarding further evaluation, prior history of stroke and ongoing management of stroke risk factors and answered all other questions to patient and son satisfaction   Frann Rider, AGNP-BC  [facility] [address] [place], [place] [address]  Phone [phone] Fax [phone] Note: This document was prepared with digital dictation and possible smart  Company secretary. Any transcriptional errors that result from this process are unintentional.

## 2020-06-26 ENCOUNTER — Encounter: Payer: Self-pay | Admitting: Adult Health

## 2020-06-26 ENCOUNTER — Ambulatory Visit: Payer: Medicare Other | Admitting: Adult Health

## 2020-06-29 NOTE — Progress Notes (Signed)
I agree with the above plan 

## 2020-06-30 ENCOUNTER — Other Ambulatory Visit: Payer: Self-pay | Admitting: Adult Health

## 2020-06-30 ENCOUNTER — Telehealth: Payer: Self-pay | Admitting: Emergency Medicine

## 2020-06-30 DIAGNOSIS — G309 Alzheimer's disease, unspecified: Secondary | ICD-10-CM

## 2020-06-30 DIAGNOSIS — R4189 Other symptoms and signs involving cognitive functions and awareness: Secondary | ICD-10-CM

## 2020-06-30 NOTE — Progress Notes (Signed)
Carelink Summary Report / Loop Recorder 

## 2020-06-30 NOTE — Telephone Encounter (Signed)
Called and LVM notifying that EEG has been ordered and time frame to expect for call from scheduler.   Left office number to return call if there are any questions.

## 2020-06-30 NOTE — Progress Notes (Signed)
Per Dr. Clydene Fake recommendation, will proceed with EEG for cognitive decline with hx of dementia

## 2020-07-01 ENCOUNTER — Other Ambulatory Visit: Payer: Self-pay | Admitting: *Deleted

## 2020-07-01 ENCOUNTER — Telehealth: Payer: Self-pay | Admitting: Adult Health

## 2020-07-01 MED ORDER — METOPROLOL TARTRATE 25 MG PO TABS: 25.0000 mg | ORAL_TABLET | Freq: Two times a day (BID) | ORAL | 1 refills | Status: DC

## 2020-07-01 NOTE — Telephone Encounter (Signed)
LVM for pt to call back to schedule [facility] 3/22

## 2020-07-18 DIAGNOSIS — R197 Diarrhea, unspecified: Secondary | ICD-10-CM | POA: Diagnosis not present

## 2020-07-18 DIAGNOSIS — R1084 Generalized abdominal pain: Secondary | ICD-10-CM | POA: Diagnosis not present

## 2020-07-20 LAB — CUP PACEART REMOTE DEVICE CHECK
Date Time Interrogation Session: 20220410184559
Implantable Pulse Generator Implant Date: 20210706

## 2020-07-23 ENCOUNTER — Telehealth: Payer: Self-pay | Admitting: Adult Health

## 2020-07-23 NOTE — Telephone Encounter (Signed)
I will call son, I know we do this for seizures, anything else to relay?

## 2020-07-23 NOTE — Telephone Encounter (Signed)
This was recommended by Dr. Leonie Man to further assess for possible underlying causes of memory decline. If he wishes to hold off, that would be okay.

## 2020-07-23 NOTE — Telephone Encounter (Signed)
Pt's son called wanting to know if the EEG is actually necessary. Please advise.

## 2020-07-23 NOTE — Telephone Encounter (Signed)
Called son, LMVM for him that per Dr. Leonie Man, recommended to further assess for any possible underlying causes of memory decline.  If wishes to hold off that is ok to do.  Would call and cancel the appt.  Call back if questions.

## 2020-07-24 DIAGNOSIS — H40033 Anatomical narrow angle, bilateral: Secondary | ICD-10-CM | POA: Diagnosis not present

## 2020-07-24 DIAGNOSIS — H40053 Ocular hypertension, bilateral: Secondary | ICD-10-CM | POA: Diagnosis not present

## 2020-07-28 ENCOUNTER — Other Ambulatory Visit: Payer: Medicare Other

## 2020-07-28 ENCOUNTER — Ambulatory Visit (INDEPENDENT_AMBULATORY_CARE_PROVIDER_SITE_OTHER): Payer: Medicare Other

## 2020-07-28 DIAGNOSIS — I639 Cerebral infarction, unspecified: Secondary | ICD-10-CM | POA: Diagnosis not present

## 2020-08-01 ENCOUNTER — Observation Stay (HOSPITAL_COMMUNITY)
Admission: EM | Admit: 2020-08-01 | Discharge: 2020-08-02 | Disposition: A | Discharge status: Home / Self Care | Payer: Medicare Other | Attending: Internal Medicine | Admitting: Internal Medicine

## 2020-08-01 ENCOUNTER — Emergency Department (HOSPITAL_COMMUNITY): Payer: Medicare Other

## 2020-08-01 DIAGNOSIS — G301 Alzheimer's disease with late onset: Secondary | ICD-10-CM | POA: Diagnosis not present

## 2020-08-01 DIAGNOSIS — E876 Hypokalemia: Principal | ICD-10-CM

## 2020-08-01 DIAGNOSIS — I10 Essential (primary) hypertension: Secondary | ICD-10-CM | POA: Insufficient documentation

## 2020-08-01 DIAGNOSIS — I6782 Cerebral ischemia: Secondary | ICD-10-CM | POA: Insufficient documentation

## 2020-08-01 DIAGNOSIS — R159 Full incontinence of feces: Secondary | ICD-10-CM | POA: Insufficient documentation

## 2020-08-01 DIAGNOSIS — Z7901 Long term (current) use of anticoagulants: Secondary | ICD-10-CM | POA: Insufficient documentation

## 2020-08-01 DIAGNOSIS — R4702 Dysphasia: Secondary | ICD-10-CM | POA: Diagnosis not present

## 2020-08-01 DIAGNOSIS — K529 Noninfective gastroenteritis and colitis, unspecified: Secondary | ICD-10-CM | POA: Insufficient documentation

## 2020-08-01 DIAGNOSIS — I4891 Unspecified atrial fibrillation: Secondary | ICD-10-CM | POA: Diagnosis not present

## 2020-08-01 DIAGNOSIS — Z20822 Contact with and (suspected) exposure to covid-19: Secondary | ICD-10-CM | POA: Insufficient documentation

## 2020-08-01 DIAGNOSIS — R197 Diarrhea, unspecified: Secondary | ICD-10-CM | POA: Diagnosis not present

## 2020-08-01 DIAGNOSIS — F0150 Vascular dementia without behavioral disturbance: Secondary | ICD-10-CM | POA: Diagnosis not present

## 2020-08-01 DIAGNOSIS — Z79899 Other long term (current) drug therapy: Secondary | ICD-10-CM | POA: Diagnosis not present

## 2020-08-01 DIAGNOSIS — R2990 Unspecified symptoms and signs involving the nervous system: Secondary | ICD-10-CM

## 2020-08-01 DIAGNOSIS — R29810 Facial weakness: Secondary | ICD-10-CM | POA: Diagnosis not present

## 2020-08-01 DIAGNOSIS — R0902 Hypoxemia: Secondary | ICD-10-CM | POA: Diagnosis not present

## 2020-08-01 DIAGNOSIS — I639 Cerebral infarction, unspecified: Secondary | ICD-10-CM | POA: Diagnosis not present

## 2020-08-01 DIAGNOSIS — Z743 Need for continuous supervision: Secondary | ICD-10-CM | POA: Diagnosis not present

## 2020-08-01 DIAGNOSIS — R6889 Other general symptoms and signs: Secondary | ICD-10-CM | POA: Diagnosis not present

## 2020-08-01 LAB — BASIC METABOLIC PANEL
Anion gap: 8 (ref 5–15)
Anion gap: 12 (ref 5–15)
BUN: 13 mg/dL (ref 8–23)
BUN: 11 mg/dL (ref 8–23)
CO2: 25 mmol/L (ref 22–32)
CO2: 21 mmol/L — ABNORMAL LOW (ref 22–32)
Calcium: 7.8 mg/dL — ABNORMAL LOW (ref 8.9–10.3)
Calcium: 7.4 mg/dL — ABNORMAL LOW (ref 8.9–10.3)
Chloride: 108 mmol/L (ref 98–111)
Chloride: 108 mmol/L (ref 98–111)
Creatinine, Ser: 1.39 mg/dL — ABNORMAL HIGH (ref 0.44–1.00)
Creatinine, Ser: 1.25 mg/dL — ABNORMAL HIGH (ref 0.44–1.00)
GFR, Estimated: 38 mL/min — ABNORMAL LOW (ref >60)
GFR, Estimated: 43 mL/min — ABNORMAL LOW (ref >60)
Glucose, Bld: 98 mg/dL (ref 70–99)
Glucose, Bld: 91 mg/dL (ref 70–99)
Potassium: 2 mmol/L — CL (ref 3.5–5.1)
Potassium: 2.2 mmol/L — CL (ref 3.5–5.1)
Sodium: 141 mmol/L (ref 135–145)
Sodium: 141 mmol/L (ref 135–145)

## 2020-08-01 LAB — HEPATIC FUNCTION PANEL
ALT: 11 U/L (ref 0–44)
AST: 14 U/L — ABNORMAL LOW (ref 15–41)
Albumin: 3.3 g/dL — ABNORMAL LOW (ref 3.5–5.0)
Alkaline Phosphatase: 81 U/L (ref 38–126)
Bilirubin, Direct: 0.1 mg/dL (ref 0.0–0.2)
Indirect Bilirubin: 0.3 mg/dL (ref 0.3–0.9)
Total Bilirubin: 0.4 mg/dL (ref 0.3–1.2)
Total Protein: 6.5 g/dL (ref 6.5–8.1)

## 2020-08-01 LAB — CBC WITH DIFFERENTIAL/PLATELET
Abs Immature Granulocytes: 0.04 10*3/uL (ref 0.00–0.07)
Basophils Absolute: 0 10*3/uL (ref 0.0–0.1)
Basophils Relative: 0 %
Eosinophils Absolute: 0.1 10*3/uL (ref 0.0–0.5)
Eosinophils Relative: 1 %
HCT: 36.6 % (ref 36.0–46.0)
Hemoglobin: 11.7 g/dL — ABNORMAL LOW (ref 12.0–15.0)
Immature Granulocytes: 0 %
Lymphocytes Relative: 19 %
Lymphs Abs: 1.9 10*3/uL (ref 0.7–4.0)
MCH: 27.9 pg (ref 26.0–34.0)
MCHC: 32 g/dL (ref 30.0–36.0)
MCV: 87.4 fL (ref 80.0–100.0)
Monocytes Absolute: 0.7 10*3/uL (ref 0.1–1.0)
Monocytes Relative: 8 %
Neutro Abs: 7 10*3/uL (ref 1.7–7.7)
Neutrophils Relative %: 72 %
Platelets: 199 10*3/uL (ref 150–400)
RBC: 4.19 MIL/uL (ref 3.87–5.11)
RDW: 14.6 % (ref 11.5–15.5)
WBC: 9.7 10*3/uL (ref 4.0–10.5)
nRBC: 0 % (ref 0.0–0.2)

## 2020-08-01 LAB — URINALYSIS, ROUTINE W REFLEX MICROSCOPIC
Bilirubin Urine: NEGATIVE
Glucose, UA: NEGATIVE mg/dL
Ketones, ur: NEGATIVE mg/dL
Leukocytes,Ua: NEGATIVE
Nitrite: NEGATIVE
Protein, ur: 30 mg/dL — AB
Specific Gravity, Urine: 1.014 (ref 1.005–1.030)
pH: 6 (ref 5.0–8.0)

## 2020-08-01 LAB — MAGNESIUM: Magnesium: 1.4 mg/dL — ABNORMAL LOW (ref 1.7–2.4)

## 2020-08-01 LAB — RESP PANEL BY RT-PCR (FLU A&B, COVID) ARPGX2
Influenza A by PCR: NEGATIVE
Influenza B by PCR: NEGATIVE
SARS Coronavirus 2 by RT PCR: NEGATIVE

## 2020-08-01 MED ORDER — APIXABAN 2.5 MG PO TABS
2.5000 mg | ORAL_TABLET | Freq: Two times a day (BID) | ORAL | Status: DC
  Administered 2020-08-01 – 2020-08-02 (×3): 2.5 mg via ORAL
  Filled 2020-08-01 (×4): qty 1

## 2020-08-01 MED ORDER — METOPROLOL TARTRATE 12.5 MG HALF TABLET
12.5000 mg | ORAL_TABLET | Freq: Two times a day (BID) | ORAL | Status: DC
  Administered 2020-08-01 – 2020-08-02 (×2): 12.5 mg via ORAL
  Filled 2020-08-01 (×2): qty 1

## 2020-08-01 MED ORDER — PANTOPRAZOLE SODIUM 40 MG PO TBEC: 40.0000 mg | DELAYED_RELEASE_TABLET | Freq: Every day | ORAL | Status: DC

## 2020-08-01 MED ORDER — ACETAMINOPHEN 650 MG RE SUPP: 650.0000 mg | Freq: Four times a day (QID) | RECTAL | Status: DC | PRN

## 2020-08-01 MED ORDER — POTASSIUM CHLORIDE 10 MEQ/100ML IV SOLN
10.0000 meq | INTRAVENOUS | Status: AC
  Administered 2020-08-01 (×5): 10 meq via INTRAVENOUS
  Filled 2020-08-01 (×5): qty 100

## 2020-08-01 MED ORDER — METOPROLOL TARTRATE 25 MG PO TABS: 25.0000 mg | ORAL_TABLET | Freq: Two times a day (BID) | ORAL | Status: DC

## 2020-08-01 MED ORDER — QUETIAPINE FUMARATE 25 MG PO TABS
12.5000 mg | ORAL_TABLET | ORAL | Status: AC
  Administered 2020-08-02: 12.5 mg via ORAL
  Filled 2020-08-01: qty 1

## 2020-08-01 MED ORDER — POTASSIUM CHLORIDE CRYS ER 20 MEQ PO TBCR
40.0000 meq | EXTENDED_RELEASE_TABLET | Freq: Once | ORAL | Status: AC
  Administered 2020-08-01: 40 meq via ORAL
  Filled 2020-08-01: qty 2

## 2020-08-01 MED ORDER — POTASSIUM CHLORIDE 10 MEQ/100ML IV SOLN
10.0000 meq | INTRAVENOUS | Status: AC
  Administered 2020-08-01 – 2020-08-02 (×4): 10 meq via INTRAVENOUS
  Filled 2020-08-01 (×4): qty 100

## 2020-08-01 MED ORDER — MELATONIN 3 MG PO TABS
3.0000 mg | ORAL_TABLET | Freq: Every day | ORAL | Status: DC
  Administered 2020-08-01: 3 mg via ORAL
  Filled 2020-08-01: qty 1

## 2020-08-01 MED ORDER — NETARSUDIL-LATANOPROST 0.02-0.005 % OP SOLN: 1.0000 [drp] | Freq: Every evening | OPHTHALMIC | Status: DC

## 2020-08-01 MED ORDER — SODIUM CHLORIDE 0.9 % IV SOLN: INTRAVENOUS | Status: AC

## 2020-08-01 MED ORDER — SODIUM CHLORIDE 0.9% FLUSH
3.0000 mL | Freq: Two times a day (BID) | INTRAVENOUS | Status: DC
  Administered 2020-08-01 – 2020-08-02 (×2): 3 mL via INTRAVENOUS

## 2020-08-01 MED ORDER — ACETAMINOPHEN 325 MG PO TABS: 650.0000 mg | ORAL_TABLET | Freq: Four times a day (QID) | ORAL | Status: DC | PRN

## 2020-08-01 MED ORDER — MAGNESIUM SULFATE 2 GM/50ML IV SOLN
2.0000 g | Freq: Once | INTRAVENOUS | Status: AC
  Administered 2020-08-01: 2 g via INTRAVENOUS
  Filled 2020-08-01: qty 50

## 2020-08-01 NOTE — Progress Notes (Addendum)
Paged by RN that patient was having agitation and pulled of her pure wick.  Charge RN spoke with patient's son earlier today who was quite worried about mother's level of agitation in the evenings during prior hospitalization.  During chart review it appears that she required Seroquel in the evenings for agitation.  Patient with mitten and intermittent sitter.  Patient continued to pull off her mittens and leads.  Slept for bit after melatonin, but continued to have agitation and there was concern that she would remove her lines.  Seroquel 12.5 mg ordered.  Addendum 0151 Patient continuing to attempt to get out of bed.  Sitter no longer available.  I do believe restraints would agitate her more, will give 1 mg Haldol.  Addendum 0400 Patient resting comfortably after 1 mg haldol, but now up and restless again per nursing staff. Additional 1 mg haldol ordered.  Nursing staff requested sitter order, however per charge RN, no sitter available.  Plan will be to give 1 mg Haldol as well as soft restraints and if patient does well with 1 mg Haldol will have restraints removed.

## 2020-08-01 NOTE — ED Triage Notes (Signed)
Pt has hx of dementia and lives at home with family. Daughter reports at [place] pt defecated on herself and was talking less at home. 0800AM when slurred speech and right facial droop observed. EMS arrived on scene and symptoms already resolved. Alert and oriented x 2 which is pt baseline. Hx of stroke in the past.

## 2020-08-01 NOTE — Progress Notes (Signed)
   Subjective:   Patient is still at her mental baseline.  Denies pain and states she feels "better" but unable to specify how.   Patient was very agitated overnight.  Attempted to call her with melatonin, then Seroquel, then Haldol without success.  There was no sitter available.  She was placed in four point restraints, family notified.  Somewhat improved this morning, nursing staff asked for wrist restraints only which was ordered.  Objective:  Vital signs in last 24 hours: Vitals:   08/01/20 1544 08/01/20 1600 08/01/20 1652 08/01/20 1800  BP: (!) 156/69 (!) 167/79 (!) 153/98   Pulse: 68 66 78   Resp: (!) 23 15 15    Temp:   98.1 F (36.7 C) 98.3 F (36.8 C)  TempSrc:   Oral Oral  SpO2: 100% 100% 99%   Weight:      Height:        Physical Exam Constitutional: no acute distress, in restraints Cardiovascular: regular rate and rhythm, normal heart sounds Pulmonary: effort normal, lungs clear to ascultation bilaterally Abdominal: flat, nontender, bowel sounds normal Neurological: alert, not as hyperactive as yesterday likely due to Haldol, not oriented  Assessment/Plan: Summer [name] is a 86 y.o. female with hx of of L MCA stroke with residual aphasia in July 2021, mixed Alzheimers and vascular dementia, HTN presenting for neurologic changes, admitted for diarrhea with potassium of 2.0.  Telemetry reassuring at this time.  Active Problems:   Acute diarrhea   Diarrhea Hypokalemia Chronic diarrhea, worse over past few weeks, improved since stopping Aricept and starting cholestyramine. K of 2.0 on admission most likely due to GI losses, no concerning EKG changes.  Improved to 2.6 overnight. Magnesium was repleted, last level was in normal range.  Nursing reports only 1 episode of small diarrhea this morning.    - Continue repleting potassium - Repeat BMP this afternoon - Follow-up GI panel  Focal neurologic changes, resolved Hx L MCA stroke in 2021 Hypertension CT on  admission with no evidence of endocrine abnormality. After shared decision making with son who is the [facility], will forego further neurologic testing for now unless her neuro status changes. - Continue home Lipitor 40 mg, lisinopril 10 mg, no antiplatelet as she has Eliquis as below  A fib In normal sinus rhythm -continue home Eliquis 2.5 mg twice daily, Lopressor 25mg  BID  Mixed Alzheimer's and vascular dementia - Delirium precautions - Home melatonin - Follow-up with neurology outpatient - Bilateral wrist restraints for now - We will try to discharge in near future to avoid further disorientation and agitation  Diet:  Dysphagia 3 IVF:  none VTE:  eliquis Prior to Admission Living Arrangement:  home Anticipated Discharge Location:  home Barriers to Discharge:  hypokalemia Dispo: Anticipated discharge in approximately 0-1 day(s).   Andrew Au, MD 08/01/2020, 6:24 PM Pager: [phone] After 5pm on weekdays and 1pm on weekends: On Call pager [phone]

## 2020-08-01 NOTE — Progress Notes (Signed)
Patient admitted to 5W from ED. Patient is alert and oriented x0. Vital signs are stable and she is on room air. Has no complaints of pain. Skin is intact, no signs of skin breakdown noted on exam. Patient belongings at bedside (clothing). The patient was shown how to use the call bell. Call bell, phone and bedside table are within reach; bed is in the lowest position.

## 2020-08-01 NOTE — H&P (Addendum)
Date: 08/01/2020               Patient Name:  Summer [name] MRN: [id number]  DOB: [birth year] Age / Sex: 86 y.o., female   PCP: Jaclynn Major, NP         Medical Service: Internal Medicine Teaching Service         Attending Physician: Dr. Gilles Chiquito    First Contact: Dr. Bridgett Larsson Pager: [phone]  Second Contact: Dr. Charleen Kirks Pager: [phone]       After Hours (After 5p/  First Contact Pager: [phone]  weekends / holidays): Second Contact Pager: [phone]   Chief Complaint: neurologic changes  History of Present Illness:   Summer [name] is a 86 y.o. female with hx of L MCA stroke with residual aphasia in July 2021, mixed Alzheimers and vascular dementia, HTN presenting for worsening aphasia and facial droop with onset this morning, resolved by time of EMS transport.  Patient son is at bedside to assist with history.  Says that her mental status is worse than she typically is, but is not a typical either for a bad day.  Mr. Haydu states that patient had been experiencing diarrhea for several weeks that PCP felt was secondary to Aricept. She was told to stop taking it and start Questran (cholestyramine) to help. Ms. Wolfman continued to have diarrhea with fecal incontinence last night, although stool has become more formed. No melena or hematochezia. When they were cleaning her up in the shower, Mr. Fudala and his wife noticed she seemed altered, not wanting to talk. EMS was called, who noticed a right sided facial droop that resolved by the time she arrived to [facility]. No fevers, chills. No other sick contacts. Patient had complained of abdominal pain with diarrhea but none recently.   At baseline, patient goes to the bathroom multiple times a day and also seems always have loose stools, although family suspects its more behavioral, as she has become more fidgety overall.  He is unaware of any work-up for chronic diarrhea.  The frequency seems to have increased the last couple months. She has  been checked for UTI several times; UA always negative.  She has had some tremors going on for many months as well, usually worse than it is today.   ED Course: Found to have K of 2.0. Neuro consulted for new R-sided weakness which could be recrudescence of old stroke in light of acute illness, felt that MRI and EEG would be reasonable.  Current Outpatient Medications  Medication Instructions  . atorvastatin (LIPITOR) 40 mg, Oral, Daily  . cholecalciferol (VITAMIN D3) 1,000 Units, Oral, Daily  . cholestyramine (QUESTRAN) 4 g, Oral, Daily  . CoQ10 200 mg, Oral, Daily  . cyanocobalamin 1,000 mcg, Oral, Daily  . ELIQUIS 2.5 MG TABS tablet TAKE ONE TABLET BY MOUTH TWICE DAILY  . lisinopril (ZESTRIL) 10 mg, Oral, Daily  . melatonin 3 mg, Oral, Daily at bedtime  . metoprolol tartrate (LOPRESSOR) 25 mg, Oral, 2 times daily  . pantoprazole (PROTONIX) 40 mg, Daily  . ROCKLATAN 0.02-0.005 % SOLN 1 drop, Both Eyes, Every evening   Allergies as of 08/01/2020  . (No Known Allergies)   No past medical history on file.  Past Surgical History:  Procedure Laterality Date  . IR ANGIO INTRA EXTRACRAN SEL COM CAROTID INNOMINATE UNI L MOD SED  10/12/2019  . LOOP RECORDER INSERTION N/A 10/16/2019   Procedure: LOOP RECORDER INSERTION;  Surgeon: Thompson Grayer, MD;  Location: [facility];  Service: Cardiovascular;  Laterality: N/A;  . RADIOLOGY WITH ANESTHESIA N/A 10/12/2019   Procedure: IR WITH ANESTHESIA;  Surgeon: Luanne Bras, MD;  Location: [facility];  Service: Radiology;  Laterality: N/A;   No family history on file.  Social History   Tobacco Use  . Smoking status: Never Smoker  . Smokeless tobacco: Never Used  . Tobacco comment: Per son  Vaping Use  . Vaping Use: Never used  Substance Use Topics  . Alcohol use: Never  . Drug use: Never    Review of Systems: Review of Systems  Unable to perform ROS: Dementia  Constitutional: Negative for chills, fever and malaise/fatigue.   Respiratory: Negative for cough and shortness of breath.   Gastrointestinal: Positive for abdominal pain and diarrhea. Negative for blood in stool, melena and vomiting.     Physical Exam: Blood pressure (!) 152/110, pulse 64, temperature 98 F (36.7 C), temperature source Oral, resp. rate 15, height 5\' 2"  (1.575 m), weight 52.2 kg, SpO2 100 %. Physical Exam Vitals and nursing note reviewed.  Constitutional:      General: She is not in acute distress.    Appearance: She is not toxic-appearing.  HENT:     Head: Normocephalic and atraumatic.     Right Ear: External ear normal.     Left Ear: External ear normal.     Nose: Nose normal.     Mouth/Throat:     Mouth: Mucous membranes are moist.  Eyes:     Extraocular Movements: Extraocular movements intact.     Conjunctiva/sclera: Conjunctivae normal.     Pupils: Pupils are equal, round, and reactive to light.  Cardiovascular:     Rate and Rhythm: Normal rate and regular rhythm.     Pulses: Normal pulses.     Heart sounds: Normal heart sounds. No murmur heard. No friction rub. No gallop.   Pulmonary:     Effort: Pulmonary effort is normal.     Breath sounds: Normal breath sounds. No wheezing, rhonchi or rales.  Abdominal:     General: Abdomen is flat. Bowel sounds are normal. There is no distension.     Palpations: Abdomen is soft. There is no mass.     Tenderness: There is no abdominal tenderness. There is no guarding.  Skin:    General: Skin is warm and dry.  Neurological:     Mental Status: She is alert. Mental status is at baseline. She is disoriented.     Comments: No focal weakness.  Oriented to self but unable to tell me her last name.  Not oriented to place, time, or situation.  Psychiatric:     Comments: Dementia, at baseline      EKG: personally reviewed my interpretation is bradycardic rate of 56, sinus rhythm  CXR: personally reviewed my interpretation is no acute process  CT Head Wo Contrast  Result Date:  08/01/2020 CLINICAL DATA:  Mental status change. EXAM: CT HEAD WITHOUT CONTRAST TECHNIQUE: Contiguous axial images were obtained from the base of the skull through the vertex without intravenous contrast. COMPARISON:  CT head October 12, 2019.  MRI head October 13, 2019. FINDINGS: Brain: Similar size of the densely calcified right supraclinoid presumed meningioma, measuring approximately 2.1 x 1.8 cm (similar when remeasured on the prior). Similar size of the additional smaller calcified presumed meningioma along the left tentorium. Interval evolution of encephalomalacia associated with the left inferior frontal gyrus/operculum infarcts seen on prior MRI. Small bilateral cerebellar infarcts, better  visualized won prior MRI. Additional patchy white matter hypodensities, nonspecific but likely related to chronic microvascular ischemic disease. No acute hemorrhage. No hydrocephalus. Similar generalized cerebral atrophy with ex vacuo ventricular dilation. No new mass effect. Vascular: Calcific atherosclerosis. No hyperdense vessel identified. Skull: No acute fracture. Sinuses/Orbits: Clear sinuses.  Unremarkable orbits. Other: No sizable mastoid effusions. IMPRESSION: 1. No evidence of acute intracranial abnormality. 2. Interval evolution of encephalomalacia associated with the left inferior frontal gyrus/operculum infarcts seen on prior MRI. 3. Similar size/appearance of the putative right supraclinoid and left tentorial meningiomas, detailed above. 4. Moderate chronic microvascular ischemic disease. Electronically Signed   By: Margaretha Sheffield MD   On: 08/01/2020 11:59   Assessment & Plan by Problem: Active Problems:   Acute diarrhea   Summer [name] is a 86 y.o. female with hx of L MCA stroke with residual aphasia in July 2021, mixed Alzheimers and vascular dementia, HTN presenting for neurologic changes, admitted for diarrhea with potassium of 2.0.  Telemetry reassuring at this time.  Diarrhea Hypokalemia Reported  history of loose stools for many years, frequently goes to the bathroom but some of this is behavioral.  This has been worse over the past few weeks.  Her PCP suspected partially treated Aricept which was stopped as she already has advanced dementia.  She also started her on cholestyramine which has improved her diarrhea.  Had episode of incontinence overnight which is new.  She has had no episodes of diarrhea since arriving to the hospital.  Found to have potassium of 2.0.  Telemetry without any acute arrhythmia.  Has not had hypokalemia previously per our records, and does not take any medications that are suspicious for causing this.  Likely due to GI losses.  Son is unsure if she has had any work-up for chronic diarrhea.  - Monitor GI output - Potassium chloride by IV - Repeat BMP this afternoon, further potassium pending this result - Follow magnesium - Follow-up GI panel - Follow-up for chronic diarrhea work-up outpatient  Focal neurologic changes, resolved Hx L MCA stroke in 2021 Hypertension CT on admission with no evidence of endocrine abnormality, there is evidence of encephalomalacia and chronic medical vascular ischemic disease.  Discussed with son the potential benefits of MRI and EEG.  He does not feel strongly that we should obtain this as she is now back to her baseline and is not sure what we will change based on the results.  We agree with this, will forego further neurologic testing for now unless her neuro status changes. - Continue home Lipitor 40 mg, lisinopril 10 mg, no antiplatelet as she has Eliquis as below  A fib Demonstrated on ILR for stroke workup. CHADSVASC score of 6. -continue home Eliquis 2.5 mg twice daily  Mixed Alzheimer's and vascular dementia Was on Aricept until recently when her PCP stopped it due to concern that is worsening her diarrhea.  She has tried but not tolerated other dementia medications in the past. - Delirium precautions - Home melatonin -  Follow-up with neurology outpatient  Dispo: Admit patient to Observation with expected length of stay less than 2 midnights.  Signed: Andrew Au, MD 08/01/2020, 3:43 PM  Pager: [phone]  After 5pm on weekdays and 1pm on weekends: On Call pager: [phone]

## 2020-08-01 NOTE — ED Notes (Signed)
Pt has history of dementia and lives at home. Family reports at [place], pt defecated on herself and was talking less at home. 0800AM when pt started having slurred speech and right facial droop. EMS reports symptoms resolved when they arrived on scene. Pt has hx of stroke. Upon arrival to ED, pt NIHSS was 0. Denies pain. Alert and oriented x 2 which is pt baseline. Blood sugar was 136. MD at bedside. Will continue to monitor.

## 2020-08-01 NOTE — Care Plan (Signed)
Brief curbside by Dr. Ronnald Nian for this patient with prior left MCA stroke with some residual aphasia, Alzheimer's dementia, presenting with diarrhea for a couple of weeks as well as hypokalemia to 2.0.  Being admitted for diarrhea and hypokalemia, curbside for transient right-sided weakness this morning.  This could be recrudescence in the setting of her acute illness, but discussed that MRI brain and EEG to assess for new stroke or epileptogenic activity is reasonable.  Please reach out to neurology if these studies result positive for an acute process, if a full neurological evaluation is felt to be needed, or if new acute neurological questions arise.  Lesleigh Noe MD-PhD [facility] [phone]

## 2020-08-01 NOTE — ED Provider Notes (Signed)
[facility] EMERGENCY DEPARTMENT Provider Note   CSN: [id number] Arrival date & time: 08/01/20  0945     History Chief Complaint  Patient presents with  . Altered Mental Status    Summer [name] is a 86 y.o. female.  Patient brought here after episode of possibly stroke/altered mental status.  Supposedly may be has some right facial droop and slurred speech but resolved by the time EMS got there.  Patient had incontinence episode overnight in her bed per her son.  She has dementia and memory has been an obvious issue.  History of stroke and on Eliquis.  The history is provided by the patient and a caregiver.  Altered Mental Status Severity:  Mild Most recent episode:  Today Episode history:  Single Progression:  Resolved Chronicity:  New Context: dementia (stroke hx)   Associated symptoms: no abdominal pain, no fever, no headaches, no light-headedness, no palpitations, no rash, no seizures, no vomiting and no weakness        No past medical history on file.  Patient Active Problem List   Diagnosis Date Noted  . Essential hypertension 10/16/2019  . Hyperlipidemia LDL goal <70 10/16/2019  . HOH (hard of hearing) 10/16/2019  . CKD (chronic kidney disease) stage 3b, GFR 30-59 ml/min 10/16/2019  . Embolic cerebral infarction ([place]) - L MCA, source unknown 10/12/2019  . Middle cerebral artery embolism, left 10/12/2019    Past Surgical History:  Procedure Laterality Date  . IR ANGIO INTRA EXTRACRAN SEL COM CAROTID INNOMINATE UNI L MOD SED  10/12/2019  . LOOP RECORDER INSERTION N/A 10/16/2019   Procedure: LOOP RECORDER INSERTION;  Surgeon: Thompson Grayer, MD;  Location: [facility];  Service: Cardiovascular;  Laterality: N/A;  . RADIOLOGY WITH ANESTHESIA N/A 10/12/2019   Procedure: IR WITH ANESTHESIA;  Surgeon: Luanne Bras, MD;  Location: [facility];  Service: Radiology;  Laterality: N/A;     OB History   No obstetric history on file.     No family  history on file.  Social History   Tobacco Use  . Smoking status: Never Smoker  . Smokeless tobacco: Never Used  . Tobacco comment: Per son  Vaping Use  . Vaping Use: Never used  Substance Use Topics  . Alcohol use: Never  . Drug use: Never    Home Medications Prior to Admission medications   Medication Sig Start Date End Date Taking? Authorizing Provider  atorvastatin (LIPITOR) 40 MG tablet Take 1 tablet (40 mg total) by mouth daily. 10/17/19   Donzetta Starch, NP  Cholecalciferol (D-3-5) 125 MCG (5000 UT) capsule Take 1,000 Units by mouth daily.    [provider]  Coenzyme Q10 (COQ10) 200 MG CAPS Take 1 tablet by mouth daily.     [provider]  cyanocobalamin 1000 MCG tablet Take 1,000 mcg by mouth daily.    [provider]  donepezil (ARICEPT) 10 MG tablet TAKE ONE TABLET BY MOUTH NIGHTLY 06/30/20   Frann Rider, NP  ELIQUIS 2.5 MG TABS tablet TAKE ONE TABLET BY MOUTH TWICE DAILY 03/17/20   Sherran Needs, NP  lisinopril (ZESTRIL) 10 MG tablet Take 10 mg by mouth daily. 09/26/19   [provider]  melatonin 3 MG TABS tablet Take 3 mg by mouth at bedtime.    [provider]  metoprolol tartrate (LOPRESSOR) 25 MG tablet Take 1 tablet (25 mg total) by mouth 2 (two) times daily. 07/01/20   Shirley Friar, PA-C  pantoprazole ([place])  40 MG tablet Take 40 mg by mouth daily. 06/02/20   [provider]  ROCKLATAN 0.02-0.005 % SOLN Place 1 drop into both eyes every evening. 09/28/19   [provider]    Allergies    Patient has no known allergies.  Review of Systems   Review of Systems  Constitutional: Negative for chills and fever.  HENT: Negative for ear pain and sore throat.   Eyes: Negative for pain and visual disturbance.  Respiratory: Negative for cough and shortness of breath.   Cardiovascular: Negative for chest pain and palpitations.  Gastrointestinal: Negative for abdominal pain and vomiting.   Genitourinary: Negative for dysuria and hematuria.  Musculoskeletal: Negative for arthralgias and back pain.  Skin: Negative for color change and rash.  Neurological: Positive for facial asymmetry and speech difficulty. Negative for dizziness, tremors, seizures, syncope, weakness, light-headedness, numbness and headaches.  All other systems reviewed and are negative.   Physical Exam Updated Vital Signs  ED Triage Vitals  Enc Vitals Group     BP 08/01/20 1003 (!) 172/72     Pulse Rate 08/01/20 1003 62     Resp 08/01/20 1030 16     Temp 08/01/20 1003 98 F (36.7 C)     Temp Source 08/01/20 1003 Oral     SpO2 08/01/20 1003 100 %     Weight 08/01/20 1003 115 lb (52.2 kg)     Height 08/01/20 1003 5\' 2"  (1.575 m)     Head Circumference --      Peak Flow --      Pain Score 08/01/20 1003 0     Pain Loc --      Pain Edu? --      Excl. in GC? --     Physical Exam Vitals and nursing note reviewed.  Constitutional:      General: She is not in acute distress.    Appearance: She is well-developed. She is not ill-appearing.  HENT:     Head: Normocephalic and atraumatic.     Nose: Nose normal.     Mouth/Throat:     Mouth: Mucous membranes are dry.  Eyes:     Extraocular Movements: Extraocular movements intact.     Conjunctiva/sclera: Conjunctivae normal.     Pupils: Pupils are equal, round, and reactive to light.  Cardiovascular:     Rate and Rhythm: Normal rate and regular rhythm.     Pulses: Normal pulses.     Heart sounds: Normal heart sounds. No murmur heard.   Pulmonary:     Effort: Pulmonary effort is normal. No respiratory distress.     Breath sounds: Normal breath sounds.  Abdominal:     Palpations: Abdomen is soft.     Tenderness: There is no abdominal tenderness.  Musculoskeletal:     Cervical back: Normal range of motion and neck supple.  Skin:    General: Skin is warm and dry.     Capillary Refill: Capillary refill takes less than 2 seconds.  Neurological:      General: No focal deficit present.     Mental Status: She is alert.     Cranial Nerves: No cranial nerve deficit.     Sensory: No sensory deficit.     Motor: No weakness.     Coordination: Coordination normal.     Comments: 5+/5 strength, normal sensation, no drift, no limb ataxia, clear speech, able to answer simple question      ED Results / Procedures / Treatments  Labs (all labs ordered are listed, but only abnormal results are displayed) Labs Reviewed  CBC WITH DIFFERENTIAL/PLATELET - Abnormal; Notable for the following components:      Result Value   Hemoglobin 11.7 (*)    All other components within normal limits  BASIC METABOLIC PANEL - Abnormal; Notable for the following components:   Potassium 2.0 (*)    Creatinine, Ser 1.39 (*)    Calcium 7.8 (*)    GFR, Estimated 38 (*)    All other components within normal limits  HEPATIC FUNCTION PANEL - Abnormal; Notable for the following components:   Albumin 3.3 (*)    AST 14 (*)    All other components within normal limits  RESP PANEL BY RT-PCR (FLU A&B, COVID) [id number]  URINE CULTURE  GASTROINTESTINAL PANEL BY PCR, STOOL (REPLACES STOOL CULTURE)  C DIFFICILE QUICK SCREEN W PCR REFLEX  URINALYSIS, ROUTINE W REFLEX MICROSCOPIC  MAGNESIUM    EKG EKG Interpretation  Date/Time:  Friday August 01 2020 09:59:09 EDT Ventricular Rate:  56 PR Interval:  195 QRS Duration: 91 QT Interval:  440 QTC Calculation: 425 R Axis:   42 Text Interpretation: Sinus rhythm Borderline repolarization abnormality Confirmed by Lennice Sites ([id number]) on 08/01/2020 10:01:33 AM   Radiology CT Head Wo Contrast  Result Date: 08/01/2020 CLINICAL DATA:  Mental status change. EXAM: CT HEAD WITHOUT CONTRAST TECHNIQUE: Contiguous axial images were obtained from the base of the skull through the vertex without intravenous contrast. COMPARISON:  CT head October 12, 2019.  MRI head October 13, 2019. FINDINGS: Brain: Similar size of the densely calcified right  supraclinoid presumed meningioma, measuring approximately 2.1 x 1.8 cm (similar when remeasured on the prior). Similar size of the additional smaller calcified presumed meningioma along the left tentorium. Interval evolution of encephalomalacia associated with the left inferior frontal gyrus/operculum infarcts seen on prior MRI. Small bilateral cerebellar infarcts, better visualized won prior MRI. Additional patchy white matter hypodensities, nonspecific but likely related to chronic microvascular ischemic disease. No acute hemorrhage. No hydrocephalus. Similar generalized cerebral atrophy with ex vacuo ventricular dilation. No new mass effect. Vascular: Calcific atherosclerosis. No hyperdense vessel identified. Skull: No acute fracture. Sinuses/Orbits: Clear sinuses.  Unremarkable orbits. Other: No sizable mastoid effusions. IMPRESSION: 1. No evidence of acute intracranial abnormality. 2. Interval evolution of encephalomalacia associated with the left inferior frontal gyrus/operculum infarcts seen on prior MRI. 3. Similar size/appearance of the putative right supraclinoid and left tentorial meningiomas, detailed above. 4. Moderate chronic microvascular ischemic disease. Electronically Signed   By: Margaretha Sheffield MD   On: 08/01/2020 11:59   DG Chest Portable 1 View  Result Date: 08/01/2020 CLINICAL DATA:  Altered mental status EXAM: PORTABLE CHEST 1 VIEW COMPARISON:  None. FINDINGS: Normal heart size and mediastinal contours. No acute infiltrate or edema. No effusion or pneumothorax. No acute osseous findings. Implantable loop recorder.  Artifact from EKG leads IMPRESSION: No evidence of acute disease. Electronically Signed   By: Monte Fantasia M.D.   On: 08/01/2020 10:45    Procedures .Critical Care Performed by: Lennice Sites, DO Authorized by: Lennice Sites, DO   Critical care provider statement:    Critical care time (minutes):  45   Critical care was necessary to treat or prevent imminent or  life-threatening deterioration of the following conditions:  Dehydration (hypokalemia)   Critical care was time spent personally by me on the following activities:  Blood draw for specimens, development of treatment plan with patient or surrogate, discussions with primary provider, discussions  with consultants, examination of patient, obtaining history from patient or surrogate, evaluation of patient's response to treatment, ordering and performing treatments and interventions, ordering and review of laboratory studies, ordering and review of radiographic studies, pulse oximetry, re-evaluation of patient's condition and review of old charts   I assumed direction of critical care for this patient from another provider in my specialty: no       Medications Ordered in ED Medications  potassium chloride 10 mEq in 100 mL IVPB (10 mEq Intravenous New Bag/Given 08/01/20 1206)    ED Course  I have reviewed the triage vital signs and the nursing notes.  Pertinent labs & imaging results that were available during my care of the patient were reviewed by me and considered in my medical decision making (see chart for details).    MDM Rules/Calculators/A&P                          Summer [name] is here after strokelike symptoms.  History of stroke on Eliquis, dementia history.  Patient had incontinence episode overnight.  Seen to develop some slurred speech and right facial droop but resolved prior to EMS getting there.  She appears to be back at her baseline.  Neurologically intact.  Unremarkable vitals.  No fever.  Will get head CT and evaluate for infectious process.  Will touch base with neurology after head CT.  Difficult to tell if this was TIA or dementia related.  Seems less likely to be a seizure event.  Patient with potassium of 2.  Creatinine 1.39.  No significant leukocytosis or anemia.  CT shows no acute findings.  Does have some encephalomalacia.  Does have some meningiomas but appear to be  somewhat stable.  Chest x-ray no signs of infection.  Family does admit that she is had about 2 weeks of diarrhea.  No recent antibiotic use.  Having try to get medications to help slow down the diarrhea.  We will send off stool pathogen panel and C. difficile panel.  Will give IV repletion of potassium with multiple rounds of potassium.  I talked with neurology who recommends an MRI and EEG.  Possibly a TIA but somewhat atypical and difficult to assess given her dementia history.  They asked to be consulted if any abnormalities with those 2.  Otherwise patient to be admitted to medicine.  This chart was dictated using voice recognition software.  Despite best efforts to proofread,  errors can occur which can change the documentation meaning.    Final Clinical Impression(s) / ED Diagnoses Final diagnoses:  Hypokalemia  Stroke-like symptoms  Diarrhea, unspecified type    Rx / DC Orders ED Discharge Orders    None       Lennice Sites, DO 08/01/20 1321

## 2020-08-02 DIAGNOSIS — F0390 Unspecified dementia without behavioral disturbance: Secondary | ICD-10-CM | POA: Diagnosis not present

## 2020-08-02 DIAGNOSIS — E876 Hypokalemia: Secondary | ICD-10-CM

## 2020-08-02 DIAGNOSIS — R197 Diarrhea, unspecified: Secondary | ICD-10-CM

## 2020-08-02 LAB — URINE CULTURE: Culture: <10000 — AB

## 2020-08-02 LAB — BASIC METABOLIC PANEL
Anion gap: 12 (ref 5–15)
BUN: 8 mg/dL (ref 8–23)
CO2: 22 mmol/L (ref 22–32)
Calcium: 7.8 mg/dL — ABNORMAL LOW (ref 8.9–10.3)
Chloride: 109 mmol/L (ref 98–111)
Creatinine, Ser: 1.13 mg/dL — ABNORMAL HIGH (ref 0.44–1.00)
GFR, Estimated: 48 mL/min — ABNORMAL LOW (ref >60)
Glucose, Bld: 103 mg/dL — ABNORMAL HIGH (ref 70–99)
Potassium: 3 mmol/L — ABNORMAL LOW (ref 3.5–5.1)
Sodium: 143 mmol/L (ref 135–145)

## 2020-08-02 LAB — GASTROINTESTINAL PANEL BY PCR, STOOL (REPLACES STOOL CULTURE)

## 2020-08-02 LAB — RENAL FUNCTION PANEL
Albumin: 2.9 g/dL — ABNORMAL LOW (ref 3.5–5.0)
Anion gap: 8 (ref 5–15)
BUN: 10 mg/dL (ref 8–23)
CO2: 23 mmol/L (ref 22–32)
Calcium: 7.4 mg/dL — ABNORMAL LOW (ref 8.9–10.3)
Chloride: 110 mmol/L (ref 98–111)
Creatinine, Ser: 1.27 mg/dL — ABNORMAL HIGH (ref 0.44–1.00)
GFR, Estimated: 42 mL/min — ABNORMAL LOW (ref >60)
Glucose, Bld: 81 mg/dL (ref 70–99)
Phosphorus: 2.7 mg/dL (ref 2.5–4.6)
Potassium: 2.6 mmol/L — CL (ref 3.5–5.1)
Sodium: 141 mmol/L (ref 135–145)

## 2020-08-02 LAB — C DIFFICILE QUICK SCREEN W PCR REFLEX
C Diff antigen: NEGATIVE
C Diff interpretation: NOT DETECTED
C Diff toxin: NEGATIVE

## 2020-08-02 LAB — MAGNESIUM: Magnesium: 1.9 mg/dL (ref 1.7–2.4)

## 2020-08-02 MED ORDER — POTASSIUM CHLORIDE 20 MEQ PO PACK: 20.0000 meq | PACK | Freq: Every day | ORAL | 0 refills | Status: DC

## 2020-08-02 MED ORDER — HALOPERIDOL 1 MG PO TABS
1.0000 mg | ORAL_TABLET | Freq: Once | ORAL | Status: AC
  Administered 2020-08-02: 1 mg via ORAL
  Filled 2020-08-02: qty 1

## 2020-08-02 MED ORDER — POTASSIUM CHLORIDE 20 MEQ PO PACK: 40.0000 meq | PACK | Freq: Once | ORAL | Status: DC

## 2020-08-02 MED ORDER — POTASSIUM CHLORIDE 20 MEQ PO PACK: 40.0000 meq | PACK | ORAL | Status: DC

## 2020-08-02 MED ORDER — CHOLESTYRAMINE 4 G PO PACK
4.0000 g | PACK | Freq: Every day | ORAL | Status: DC
  Administered 2020-08-02: 4 g via ORAL
  Filled 2020-08-02: qty 1

## 2020-08-02 MED ORDER — LISINOPRIL 10 MG PO TABS
10.0000 mg | ORAL_TABLET | Freq: Every day | ORAL | Status: DC
  Administered 2020-08-02: 10 mg via ORAL
  Filled 2020-08-02: qty 1

## 2020-08-02 MED ORDER — POTASSIUM CHLORIDE 20 MEQ PO PACK
40.0000 meq | PACK | Freq: Once | ORAL | Status: AC
  Administered 2020-08-02: 40 meq via ORAL
  Filled 2020-08-02: qty 2

## 2020-08-02 MED ORDER — HALOPERIDOL 0.5 MG PO TABS
0.5000 mg | ORAL_TABLET | Freq: Once | ORAL | Status: DC
  Filled 2020-08-02: qty 1

## 2020-08-02 MED ORDER — MAGNESIUM SULFATE IN D5W 1-5 GM/100ML-% IV SOLN
1.0000 g | Freq: Once | INTRAVENOUS | Status: AC
  Administered 2020-08-02: 1 g via INTRAVENOUS
  Filled 2020-08-02: qty 100

## 2020-08-02 MED ORDER — POTASSIUM CHLORIDE 20 MEQ PO PACK
40.0000 meq | PACK | ORAL | Status: AC
  Administered 2020-08-02 (×2): 40 meq via ORAL
  Filled 2020-08-02 (×2): qty 2

## 2020-08-02 MED ORDER — POTASSIUM CHLORIDE 10 MEQ/100ML IV SOLN
10.0000 meq | INTRAVENOUS | Status: DC
Start: 2020-08-02 — End: 2020-08-02
  Administered 2020-08-02: 10 meq via INTRAVENOUS
  Filled 2020-08-02: qty 100

## 2020-08-02 NOTE — Progress Notes (Signed)
  Date: 08/02/2020  Patient name: Summer [name]  Medical record number: [id number]  Date of birth: 1934-09-22   I have seen and evaluated Summer [name] and discussed their care with the Residency Team. Briefly, Summer [name] is an 86 year old woman with dementia, h/o stroke, HTN who presented with worsening neurological symptoms that resolved upon admission to the hospital.  She further has had acute on chronic diarrhea.  Her outpatient physician had placed her on cholestyramine with some improvement per report.  On lab work at admission, she was found to have a significantly low potassium and was admitted for further care.  Overnight, she was agitated and confused requiring restraints.  This can hopefully be discontinued today.   PMHx, Fam Hx, and/or Soc Hx : She is reported to be a never smoker.   Vitals:   08/02/20 0226 08/02/20 0433  BP: (!) 151/67 (!) 159/77  Pulse: 64 63  Resp: 17 20  Temp: 97.6 F (36.4 C) 98.4 F (36.9 C)  SpO2: 98% 100%   Gen: Lying in bed, calm, wearing restraints and mittens Eyes: Anicteric sclerae HENT: Neck is supple, MMM CV: RR, NR, no murmur noted, no peripheral edema Pulm: Breathing comfortably, no wheezing noted Abd:  +BS, NTTP, mild distention MSK: Thin extremities, no contractures noted Neuro: Able to move all extremities, alert, knows name, but is disoriented to time, place Psych: Pleasant, calm  Assessment and Plan: I have seen and evaluated the patient as outlined above. I agree with the formulated Assessment and Plan as detailed in the residents' note, with the following changes:   1. Acute on chronic diarrhea - Currently on questran and improving.  - Monitor - Follow up GI panel - Cdiff negative  2. Hypokalemia - Continue to replace - Trend BMET  3. Dementia, evening agitation - Will attempt to discharge in the near future to avoid further disorientation and agitation  4. Neurologic changes - Given patient is back to baseline, her family  opted to not pursue further imaging - Monitor for change.   Sid Falcon, MD 4/23/202210:34 AM

## 2020-08-02 NOTE — Discharge Summary (Signed)
Name: Summer [name] MRN: [id number] DOB: 10/05/1934 86 y.o. PCP: Summer Major, NP  Date of Admission: 08/01/2020  9:45 AM Date of Discharge:  08/02/20 Attending Physician: Summer Falcon, MD   Discharge Diagnosis: 1. Hypokalemia 2. Chronic diarrhea 3. Mixed Alzheimer's and vascular dementia  Discharge Medications: Allergies as of 08/02/2020   No Known Allergies     Medication List    TAKE these medications   atorvastatin 40 MG tablet Commonly known as: LIPITOR Take 1 tablet (40 mg total) by mouth daily.   cholecalciferol 25 MCG (1000 UNIT) tablet Commonly known as: VITAMIN D3 Take 1,000 Units by mouth daily.   cholestyramine 4 g packet Commonly known as: QUESTRAN Take 4 g by mouth daily.   CoQ10 200 MG Caps Take 200 mg by mouth daily.   cyanocobalamin 1000 MCG tablet Take 1,000 mcg by mouth daily.   Eliquis 2.5 MG Tabs tablet Generic drug: apixaban TAKE ONE TABLET BY MOUTH TWICE DAILY What changed: how much to take   lisinopril 10 MG tablet Commonly known as: ZESTRIL Take 10 mg by mouth daily.   melatonin 3 MG Tabs tablet Take 3 mg by mouth at bedtime.   metoprolol tartrate 25 MG tablet Commonly known as: LOPRESSOR Take 1 tablet (25 mg total) by mouth 2 (two) times daily.   pantoprazole 40 MG tablet Commonly known as: PROTONIX Take 40 mg by mouth daily.   potassium chloride 20 MEQ packet Commonly known as: Klor-Con Take 20 mEq by mouth daily.   Rocklatan 0.02-0.005 % Soln Generic drug: Netarsudil-Latanoprost Place 1 drop into both eyes every evening.            Discharge Care Instructions  (From admission, onward)         Start     Ordered   08/02/20 0000  Discharge wound care:       Comments: Try to reposition frequently.   08/02/20 1456          Disposition and follow-up:   Ms.Summer [name] was discharged from [facility] in [place] condition.  At the hospital follow up visit please address:  1.  Follow  up: Marland Kitchen Hypokalemia-discharged with Klor-Con 20 mEq daily, follow-up potassium in 1 week . Chronic diarrhea- C. difficile negative, follow-up GI pathogen panel, consider further outpatient work-up if still continues  2.  Labs / imaging needed at time of follow-up: BMP  3.  Pending labs/ test needing follow-up: GI pathogen panel  Follow-up Appointments:   Hospital Course by problem list:  Chronic diarrhea Hypokalemia due to GI losses Patient has had loose stools for many years, worse for the past few weeks.  This improved somewhat with discontinuing her Aricept and starting cholestyramine.  Has had mild amount of diarrhea during this admission.  C. difficile was negative, GI pathogen panel pending.  Potassium improved with supplementation.  Discharged with further supplementation and follow-up in clinic for BMP in about 1 week.  Aphasia and facial droop These are noted at home earlier in the day, but were resolved by time of EMS transport.  This could represent recurrence of old stroke versus new neurologic event.  Discussed with her son who is the healthcare power of attorney, the pros and cons of obtaining further studies.  Using shared decision-making, we decided to forego further testing unless she had new neurologic changes.   Mixed Alzheimer's and vascular dementia Patient has advanced dementia.  Was very agitated overnight, treated with antipsychotics and ultimately required  restraints.  During the day was much more calm especially when son was around.   Pertinent Labs, Studies, and Procedures:  CT Head Wo Contrast  Result Date: 08/01/2020 CLINICAL DATA:  Mental status change. EXAM: CT HEAD WITHOUT CONTRAST TECHNIQUE: Contiguous axial images were obtained from the base of the skull through the vertex without intravenous contrast. COMPARISON:  CT head October 12, 2019.  MRI head October 13, 2019. FINDINGS: Brain: Similar size of the densely calcified right supraclinoid presumed meningioma,  measuring approximately 2.1 x 1.8 cm (similar when remeasured on the prior). Similar size of the additional smaller calcified presumed meningioma along the left tentorium. Interval evolution of encephalomalacia associated with the left inferior frontal gyrus/operculum infarcts seen on prior MRI. Small bilateral cerebellar infarcts, better visualized won prior MRI. Additional patchy white matter hypodensities, nonspecific but likely related to chronic microvascular ischemic disease. No acute hemorrhage. No hydrocephalus. Similar generalized cerebral atrophy with ex vacuo ventricular dilation. No new mass effect. Vascular: Calcific atherosclerosis. No hyperdense vessel identified. Skull: No acute fracture. Sinuses/Orbits: Clear sinuses.  Unremarkable orbits. Other: No sizable mastoid effusions. IMPRESSION: 1. No evidence of acute intracranial abnormality. 2. Interval evolution of encephalomalacia associated with the left inferior frontal gyrus/operculum infarcts seen on prior MRI. 3. Similar size/appearance of the putative right supraclinoid and left tentorial meningiomas, detailed above. 4. Moderate chronic microvascular ischemic disease. Electronically Signed   By: Summer Sheffield MD   On: 08/01/2020 11:59   C diff negative   BMP Latest Ref Rng & Units 08/02/2020 08/02/2020 08/01/2020  Glucose 70 - 99 mg/dL 103(H) 81 91  BUN 8 - 23 mg/dL 8 10 11   Creatinine 0.44 - 1.00 mg/dL 1.13(H) 1.27(H) 1.25(H)  BUN/Creat Ratio 12 - 28 - - -  Sodium 135 - 145 mmol/L 143 141 141  Potassium 3.5 - 5.1 mmol/L 3.0(L) 2.6(LL) 2.2(LL)  Chloride 98 - 111 mmol/L 109 110 108  CO2 22 - 32 mmol/L 22 23 21(L)  Calcium 8.9 - 10.3 mg/dL 7.8(L) 7.4(L) 7.4(L)     Discharge Instructions: Discharge Instructions    Diet - low sodium heart healthy   Complete by: As directed    Discharge instructions   Complete by: As directed    [name], it was a pleasure taking care of you.  You are admitted for low potassium likely due to  your diarrhea.  I will call with the results of your diarrhea pathogen panel.  I am prescribing potassium to take once daily, follow-up with your primary care doctor in about 1 week to check your electrolytes.   Discharge wound care:   Complete by: As directed    Try to reposition frequently.   Increase activity slowly   Complete by: As directed       Signed: Andrew Au, MD 08/02/2020, 2:57 PM   Pager: [phone]

## 2020-08-04 ENCOUNTER — Encounter: Payer: Self-pay | Admitting: Student

## 2020-08-04 ENCOUNTER — Other Ambulatory Visit: Payer: Self-pay | Admitting: Student

## 2020-08-04 NOTE — Progress Notes (Signed)
Called patient's son and HCPO to inform of negative results on GI panel. She does have a history of cholecystectomy so this likely the cause of her diarrhea. He states that she is starting to be too difficult for them to manage at home and he is looking into facilities, will get appointment with her PCP this week.

## 2020-08-08 DIAGNOSIS — I10 Essential (primary) hypertension: Secondary | ICD-10-CM | POA: Diagnosis not present

## 2020-08-12 NOTE — Progress Notes (Signed)
Carelink Summary Report / Loop Recorder 

## 2020-08-14 DIAGNOSIS — E782 Mixed hyperlipidemia: Secondary | ICD-10-CM | POA: Diagnosis not present

## 2020-08-14 DIAGNOSIS — Z7901 Long term (current) use of anticoagulants: Secondary | ICD-10-CM | POA: Diagnosis not present

## 2020-08-14 DIAGNOSIS — R197 Diarrhea, unspecified: Secondary | ICD-10-CM | POA: Diagnosis not present

## 2020-08-14 DIAGNOSIS — I480 Paroxysmal atrial fibrillation: Secondary | ICD-10-CM | POA: Diagnosis not present

## 2020-08-14 DIAGNOSIS — H409 Unspecified glaucoma: Secondary | ICD-10-CM | POA: Diagnosis not present

## 2020-08-14 DIAGNOSIS — G301 Alzheimer's disease with late onset: Secondary | ICD-10-CM | POA: Diagnosis not present

## 2020-08-14 DIAGNOSIS — I10 Essential (primary) hypertension: Secondary | ICD-10-CM | POA: Diagnosis not present

## 2020-08-14 DIAGNOSIS — E876 Hypokalemia: Secondary | ICD-10-CM | POA: Diagnosis not present

## 2020-08-14 DIAGNOSIS — Z8673 Personal history of transient ischemic attack (TIA), and cerebral infarction without residual deficits: Secondary | ICD-10-CM | POA: Diagnosis not present

## 2020-08-28 DIAGNOSIS — R410 Disorientation, unspecified: Secondary | ICD-10-CM | POA: Diagnosis not present

## 2020-08-28 DIAGNOSIS — G301 Alzheimer's disease with late onset: Secondary | ICD-10-CM | POA: Diagnosis not present

## 2020-08-28 DIAGNOSIS — R197 Diarrhea, unspecified: Secondary | ICD-10-CM | POA: Diagnosis not present

## 2020-09-01 ENCOUNTER — Telehealth: Payer: Self-pay | Admitting: Adult Health

## 2020-09-01 ENCOUNTER — Ambulatory Visit (INDEPENDENT_AMBULATORY_CARE_PROVIDER_SITE_OTHER): Payer: Medicare Other

## 2020-09-01 DIAGNOSIS — I639 Cerebral infarction, unspecified: Secondary | ICD-10-CM

## 2020-09-01 NOTE — Telephone Encounter (Signed)
I called and spoke to son.  Pt is now residing in [facility] unit [place] area.  She is worsening.  Her pcp follows pts in the facility and will manage her and will see [place] back prn.  Son felt comfortable with this.

## 2020-09-01 NOTE — Telephone Encounter (Signed)
Pt's son, Summer [name] (on [place]) called, cancelling appt, 6/1 due to she has been moved to assisted living in memory care. Would like a call from the nurse to discuss if need to reschedule this appt or schedule as needed.

## 2020-09-01 NOTE — Telephone Encounter (Signed)
If she is currently residing in memory care, they will take over monitoring and management of her dementia.  No follow-up indicated unless requested by family

## 2020-09-03 LAB — CUP PACEART REMOTE DEVICE CHECK
Date Time Interrogation Session: 20220513184415
Implantable Pulse Generator Implant Date: 20210706

## 2020-09-10 ENCOUNTER — Ambulatory Visit: Payer: Medicare Other | Admitting: Adult Health

## 2020-09-11 DIAGNOSIS — R410 Disorientation, unspecified: Secondary | ICD-10-CM | POA: Diagnosis not present

## 2020-09-19 DIAGNOSIS — M1990 Unspecified osteoarthritis, unspecified site: Secondary | ICD-10-CM | POA: Diagnosis not present

## 2020-09-19 DIAGNOSIS — G309 Alzheimer's disease, unspecified: Secondary | ICD-10-CM | POA: Diagnosis not present

## 2020-09-19 DIAGNOSIS — B9689 Other specified bacterial agents as the cause of diseases classified elsewhere: Secondary | ICD-10-CM | POA: Diagnosis not present

## 2020-09-19 DIAGNOSIS — R404 Transient alteration of awareness: Secondary | ICD-10-CM | POA: Diagnosis not present

## 2020-09-19 DIAGNOSIS — I4891 Unspecified atrial fibrillation: Secondary | ICD-10-CM | POA: Diagnosis not present

## 2020-09-19 DIAGNOSIS — R6889 Other general symptoms and signs: Secondary | ICD-10-CM | POA: Diagnosis not present

## 2020-09-19 DIAGNOSIS — U071 COVID-19: Secondary | ICD-10-CM | POA: Diagnosis not present

## 2020-09-19 DIAGNOSIS — I499 Cardiac arrhythmia, unspecified: Secondary | ICD-10-CM | POA: Diagnosis not present

## 2020-09-19 DIAGNOSIS — Z8673 Personal history of transient ischemic attack (TIA), and cerebral infarction without residual deficits: Secondary | ICD-10-CM | POA: Diagnosis not present

## 2020-09-19 DIAGNOSIS — R5381 Other malaise: Secondary | ICD-10-CM | POA: Diagnosis not present

## 2020-09-19 DIAGNOSIS — Z7401 Bed confinement status: Secondary | ICD-10-CM | POA: Diagnosis not present

## 2020-09-19 DIAGNOSIS — Z743 Need for continuous supervision: Secondary | ICD-10-CM | POA: Diagnosis not present

## 2020-09-19 DIAGNOSIS — R29898 Other symptoms and signs involving the musculoskeletal system: Secondary | ICD-10-CM | POA: Diagnosis not present

## 2020-09-19 DIAGNOSIS — I10 Essential (primary) hypertension: Secondary | ICD-10-CM | POA: Diagnosis not present

## 2020-09-19 DIAGNOSIS — N3000 Acute cystitis without hematuria: Secondary | ICD-10-CM | POA: Diagnosis not present

## 2020-09-19 DIAGNOSIS — R410 Disorientation, unspecified: Secondary | ICD-10-CM | POA: Diagnosis not present

## 2020-09-19 NOTE — Progress Notes (Signed)
Carelink Summary Report / Loop Recorder 

## 2020-09-25 DIAGNOSIS — U071 COVID-19: Secondary | ICD-10-CM | POA: Diagnosis not present

## 2020-09-25 DIAGNOSIS — N390 Urinary tract infection, site not specified: Secondary | ICD-10-CM | POA: Diagnosis not present

## 2020-09-29 ENCOUNTER — Ambulatory Visit (INDEPENDENT_AMBULATORY_CARE_PROVIDER_SITE_OTHER): Payer: Medicare Other

## 2020-09-29 DIAGNOSIS — I639 Cerebral infarction, unspecified: Secondary | ICD-10-CM | POA: Diagnosis not present

## 2020-09-30 LAB — CUP PACEART REMOTE DEVICE CHECK
Date Time Interrogation Session: 20220615184559
Implantable Pulse Generator Implant Date: 20210706

## 2020-10-02 DIAGNOSIS — U071 COVID-19: Secondary | ICD-10-CM | POA: Diagnosis not present

## 2020-10-09 DIAGNOSIS — U071 COVID-19: Secondary | ICD-10-CM | POA: Diagnosis not present

## 2020-10-09 DIAGNOSIS — R5381 Other malaise: Secondary | ICD-10-CM | POA: Diagnosis not present

## 2020-10-09 DIAGNOSIS — M6281 Muscle weakness (generalized): Secondary | ICD-10-CM | POA: Diagnosis not present

## 2020-10-09 DIAGNOSIS — R2681 Unsteadiness on feet: Secondary | ICD-10-CM | POA: Diagnosis not present

## 2020-10-09 DIAGNOSIS — R531 Weakness: Secondary | ICD-10-CM | POA: Diagnosis not present

## 2020-10-16 NOTE — Progress Notes (Signed)
Carelink Summary Report / Loop Recorder 

## 2020-10-29 LAB — CUP PACEART REMOTE DEVICE CHECK
Date Time Interrogation Session: 20220718184344
Implantable Pulse Generator Implant Date: 20210706

## 2020-11-03 ENCOUNTER — Ambulatory Visit (INDEPENDENT_AMBULATORY_CARE_PROVIDER_SITE_OTHER): Payer: Medicare Other

## 2020-11-03 DIAGNOSIS — I639 Cerebral infarction, unspecified: Secondary | ICD-10-CM | POA: Diagnosis not present

## 2020-11-20 DIAGNOSIS — K219 Gastro-esophageal reflux disease without esophagitis: Secondary | ICD-10-CM | POA: Diagnosis not present

## 2020-11-20 DIAGNOSIS — E876 Hypokalemia: Secondary | ICD-10-CM | POA: Diagnosis not present

## 2020-11-20 DIAGNOSIS — H409 Unspecified glaucoma: Secondary | ICD-10-CM | POA: Diagnosis not present

## 2020-11-20 DIAGNOSIS — Z7901 Long term (current) use of anticoagulants: Secondary | ICD-10-CM | POA: Diagnosis not present

## 2020-11-20 DIAGNOSIS — E782 Mixed hyperlipidemia: Secondary | ICD-10-CM | POA: Diagnosis not present

## 2020-11-20 DIAGNOSIS — R197 Diarrhea, unspecified: Secondary | ICD-10-CM | POA: Diagnosis not present

## 2020-11-20 DIAGNOSIS — Z86018 Personal history of other benign neoplasm: Secondary | ICD-10-CM | POA: Diagnosis not present

## 2020-11-20 DIAGNOSIS — E559 Vitamin D deficiency, unspecified: Secondary | ICD-10-CM | POA: Diagnosis not present

## 2020-11-20 DIAGNOSIS — I10 Essential (primary) hypertension: Secondary | ICD-10-CM | POA: Diagnosis not present

## 2020-11-20 DIAGNOSIS — G301 Alzheimer's disease with late onset: Secondary | ICD-10-CM | POA: Diagnosis not present

## 2020-11-20 DIAGNOSIS — Z8673 Personal history of transient ischemic attack (TIA), and cerebral infarction without residual deficits: Secondary | ICD-10-CM | POA: Diagnosis not present

## 2020-11-25 DIAGNOSIS — D518 Other vitamin B12 deficiency anemias: Secondary | ICD-10-CM | POA: Diagnosis not present

## 2020-11-25 DIAGNOSIS — E119 Type 2 diabetes mellitus without complications: Secondary | ICD-10-CM | POA: Diagnosis not present

## 2020-11-25 DIAGNOSIS — Z79899 Other long term (current) drug therapy: Secondary | ICD-10-CM | POA: Diagnosis not present

## 2020-11-25 DIAGNOSIS — E7849 Other hyperlipidemia: Secondary | ICD-10-CM | POA: Diagnosis not present

## 2020-11-25 NOTE — Progress Notes (Signed)
Carelink Summary Report / Loop Recorder 

## 2020-12-08 ENCOUNTER — Ambulatory Visit (INDEPENDENT_AMBULATORY_CARE_PROVIDER_SITE_OTHER): Payer: Medicare Other

## 2020-12-08 DIAGNOSIS — I639 Cerebral infarction, unspecified: Secondary | ICD-10-CM

## 2020-12-09 LAB — CUP PACEART REMOTE DEVICE CHECK
Date Time Interrogation Session: 20220820184440
Implantable Pulse Generator Implant Date: 20210706

## 2020-12-18 NOTE — Progress Notes (Signed)
Carelink Summary Report / Loop Recorder 

## 2021-01-07 LAB — CUP PACEART REMOTE DEVICE CHECK
Date Time Interrogation Session: 20220922184243
Implantable Pulse Generator Implant Date: 20210706

## 2021-01-08 ENCOUNTER — Ambulatory Visit (INDEPENDENT_AMBULATORY_CARE_PROVIDER_SITE_OTHER): Payer: Medicare Other

## 2021-01-08 DIAGNOSIS — I639 Cerebral infarction, unspecified: Secondary | ICD-10-CM

## 2021-01-15 NOTE — Progress Notes (Signed)
Carelink Summary Report / Loop Recorder 

## 2021-01-22 DIAGNOSIS — R451 Restlessness and agitation: Secondary | ICD-10-CM | POA: Diagnosis not present

## 2021-01-22 DIAGNOSIS — G301 Alzheimer's disease with late onset: Secondary | ICD-10-CM | POA: Diagnosis not present

## 2021-01-29 DIAGNOSIS — S90111A Contusion of right great toe without damage to nail, initial encounter: Secondary | ICD-10-CM | POA: Diagnosis not present

## 2021-01-29 DIAGNOSIS — G301 Alzheimer's disease with late onset: Secondary | ICD-10-CM | POA: Diagnosis not present

## 2021-01-29 DIAGNOSIS — I7091 Generalized atherosclerosis: Secondary | ICD-10-CM | POA: Diagnosis not present

## 2021-01-29 DIAGNOSIS — S99921A Unspecified injury of right foot, initial encounter: Secondary | ICD-10-CM | POA: Diagnosis not present

## 2021-02-04 LAB — CUP PACEART REMOTE DEVICE CHECK
Date Time Interrogation Session: 20221025184624
Implantable Pulse Generator Implant Date: 20210706

## 2021-02-10 ENCOUNTER — Ambulatory Visit (INDEPENDENT_AMBULATORY_CARE_PROVIDER_SITE_OTHER): Payer: Medicare Other

## 2021-02-10 DIAGNOSIS — I639 Cerebral infarction, unspecified: Secondary | ICD-10-CM

## 2021-02-17 NOTE — Progress Notes (Signed)
Carelink Summary Report / Loop Recorder 

## 2021-03-06 DIAGNOSIS — M47812 Spondylosis without myelopathy or radiculopathy, cervical region: Secondary | ICD-10-CM | POA: Diagnosis not present

## 2021-03-06 DIAGNOSIS — R410 Disorientation, unspecified: Secondary | ICD-10-CM | POA: Diagnosis not present

## 2021-03-06 DIAGNOSIS — Z743 Need for continuous supervision: Secondary | ICD-10-CM | POA: Diagnosis not present

## 2021-03-06 DIAGNOSIS — W19XXXA Unspecified fall, initial encounter: Secondary | ICD-10-CM | POA: Diagnosis not present

## 2021-03-06 DIAGNOSIS — I10 Essential (primary) hypertension: Secondary | ICD-10-CM | POA: Diagnosis not present

## 2021-03-06 DIAGNOSIS — G319 Degenerative disease of nervous system, unspecified: Secondary | ICD-10-CM | POA: Diagnosis not present

## 2021-03-06 DIAGNOSIS — N3000 Acute cystitis without hematuria: Secondary | ICD-10-CM | POA: Diagnosis not present

## 2021-03-06 DIAGNOSIS — S0990XA Unspecified injury of head, initial encounter: Secondary | ICD-10-CM | POA: Diagnosis not present

## 2021-03-06 DIAGNOSIS — M5031 Other cervical disc degeneration,  high cervical region: Secondary | ICD-10-CM | POA: Diagnosis not present

## 2021-03-06 DIAGNOSIS — R279 Unspecified lack of coordination: Secondary | ICD-10-CM | POA: Diagnosis not present

## 2021-03-06 DIAGNOSIS — Z043 Encounter for examination and observation following other accident: Secondary | ICD-10-CM | POA: Diagnosis not present

## 2021-03-06 DIAGNOSIS — F03C0 Unspecified dementia, severe, without behavioral disturbance, psychotic disturbance, mood disturbance, and anxiety: Secondary | ICD-10-CM | POA: Diagnosis not present

## 2021-03-09 ENCOUNTER — Ambulatory Visit (INDEPENDENT_AMBULATORY_CARE_PROVIDER_SITE_OTHER): Payer: Medicare Other

## 2021-03-09 DIAGNOSIS — I639 Cerebral infarction, unspecified: Secondary | ICD-10-CM

## 2021-03-10 LAB — CUP PACEART REMOTE DEVICE CHECK
Date Time Interrogation Session: 20221127184754
Implantable Pulse Generator Implant Date: 20210706

## 2021-03-16 NOTE — Progress Notes (Signed)
Carelink Summary Report / Loop Recorder 

## 2021-03-26 DIAGNOSIS — Z8673 Personal history of transient ischemic attack (TIA), and cerebral infarction without residual deficits: Secondary | ICD-10-CM | POA: Diagnosis not present

## 2021-03-26 DIAGNOSIS — E782 Mixed hyperlipidemia: Secondary | ICD-10-CM | POA: Diagnosis not present

## 2021-03-26 DIAGNOSIS — E559 Vitamin D deficiency, unspecified: Secondary | ICD-10-CM | POA: Diagnosis not present

## 2021-03-26 DIAGNOSIS — R197 Diarrhea, unspecified: Secondary | ICD-10-CM | POA: Diagnosis not present

## 2021-03-26 DIAGNOSIS — I10 Essential (primary) hypertension: Secondary | ICD-10-CM | POA: Diagnosis not present

## 2021-03-26 DIAGNOSIS — D329 Benign neoplasm of meninges, unspecified: Secondary | ICD-10-CM | POA: Diagnosis not present

## 2021-03-26 DIAGNOSIS — H409 Unspecified glaucoma: Secondary | ICD-10-CM | POA: Diagnosis not present

## 2021-03-26 DIAGNOSIS — G301 Alzheimer's disease with late onset: Secondary | ICD-10-CM | POA: Diagnosis not present

## 2021-03-26 DIAGNOSIS — E876 Hypokalemia: Secondary | ICD-10-CM | POA: Diagnosis not present

## 2021-04-07 DIAGNOSIS — I7091 Generalized atherosclerosis: Secondary | ICD-10-CM | POA: Diagnosis not present

## 2021-04-14 ENCOUNTER — Ambulatory Visit (INDEPENDENT_AMBULATORY_CARE_PROVIDER_SITE_OTHER): Payer: Medicare Other

## 2021-04-14 DIAGNOSIS — I639 Cerebral infarction, unspecified: Secondary | ICD-10-CM

## 2021-04-14 LAB — CUP PACEART REMOTE DEVICE CHECK
Date Time Interrogation Session: 20230102231347
Implantable Pulse Generator Implant Date: 20210706

## 2021-04-22 NOTE — Progress Notes (Signed)
Carelink Summary Report / Loop Recorder 

## 2021-05-18 ENCOUNTER — Ambulatory Visit (INDEPENDENT_AMBULATORY_CARE_PROVIDER_SITE_OTHER): Payer: Medicare Other

## 2021-05-18 DIAGNOSIS — I639 Cerebral infarction, unspecified: Secondary | ICD-10-CM

## 2021-05-18 LAB — CUP PACEART REMOTE DEVICE CHECK
Date Time Interrogation Session: 20230205230541
Implantable Pulse Generator Implant Date: 20210706

## 2021-05-20 NOTE — Progress Notes (Signed)
Carelink Summary Report / Loop Recorder 

## 2021-06-08 DIAGNOSIS — I7091 Generalized atherosclerosis: Secondary | ICD-10-CM | POA: Diagnosis not present

## 2021-06-08 DIAGNOSIS — B351 Tinea unguium: Secondary | ICD-10-CM | POA: Diagnosis not present

## 2021-06-22 ENCOUNTER — Ambulatory Visit (INDEPENDENT_AMBULATORY_CARE_PROVIDER_SITE_OTHER): Payer: Medicare Other

## 2021-06-22 DIAGNOSIS — I639 Cerebral infarction, unspecified: Secondary | ICD-10-CM

## 2021-06-22 LAB — CUP PACEART REMOTE DEVICE CHECK
Date Time Interrogation Session: 20230313105021
Implantable Pulse Generator Implant Date: 20210706

## 2021-06-26 IMAGING — CT CT HEAD W/O CM
4 series · 16 of 47 positions shown, 18 images · non-contrast
Comparison: CT head October 12, 2019.  MRI head October 13, 2019.

CLINICAL DATA: Mental status change.

EXAM:
CT HEAD WITHOUT CONTRAST
TECHNIQUE: Contiguous axial images were obtained from the base of the skull
through the vertex without intravenous contrast.

[Series 3: head without · axial · non-contrast · 0.42mm/px · z∈[-99,+21]mm · 7 of 34 slices shown, 9 images]
[im 5/34  brain]
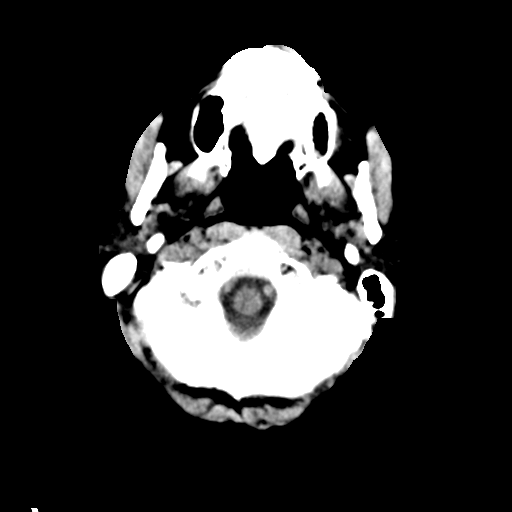
[im 5/34  bone]
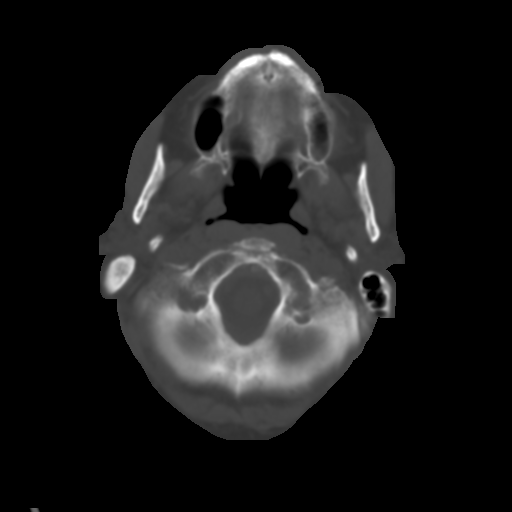
[im 9/34  brain]
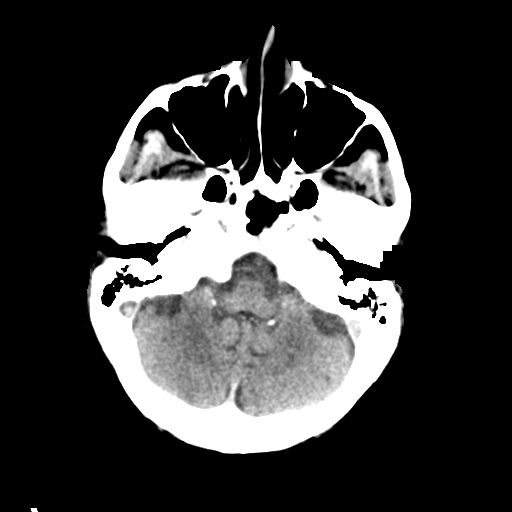
[im 13/34  brain]
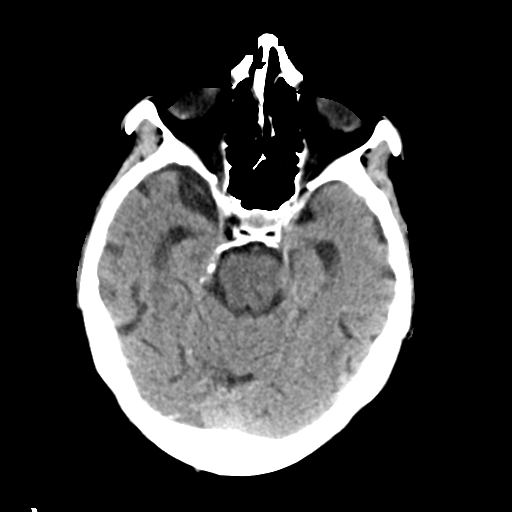
[im 17/34  brain]
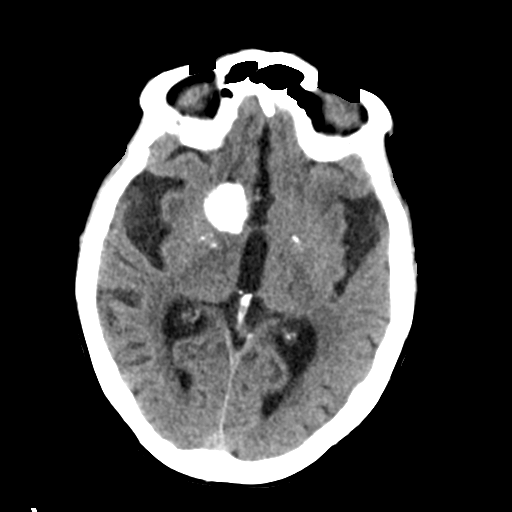
[im 21/34  brain]
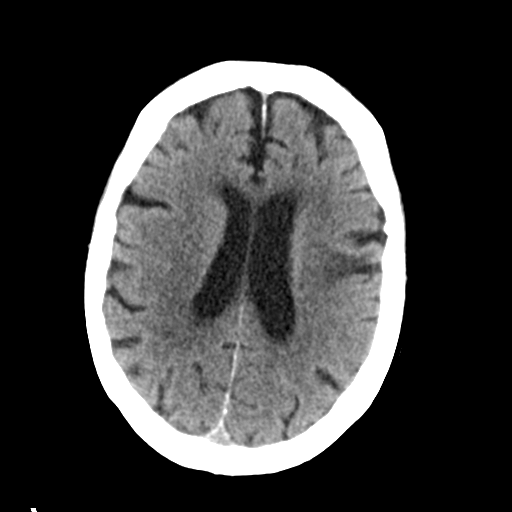
[im 21/34  bone]
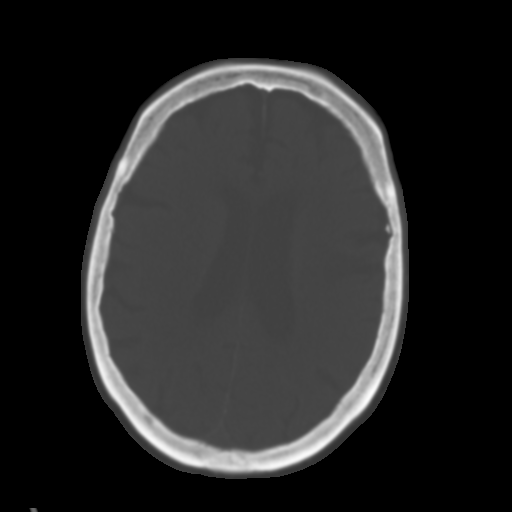
[im 25/34  brain]
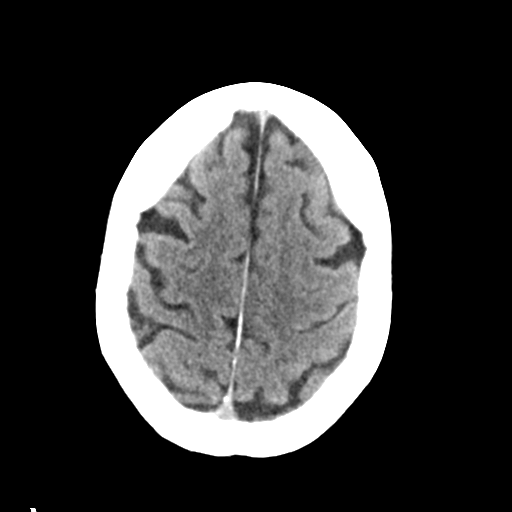
[im 29/34  brain]
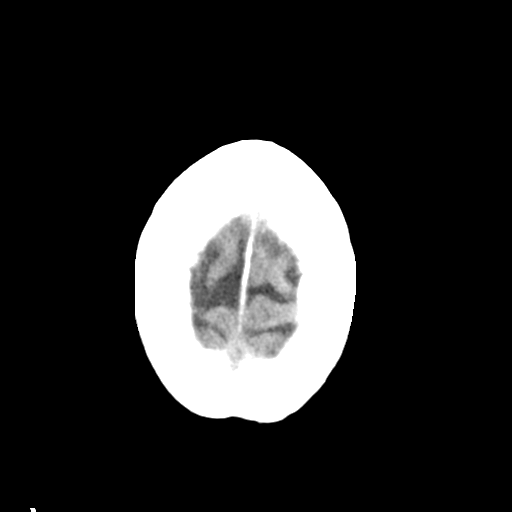

[Series 4: head bone · axial · 0.42mm/px · z∈[-103,-69]mm · 3 of 85 slices shown]
[im 9/85  bone]
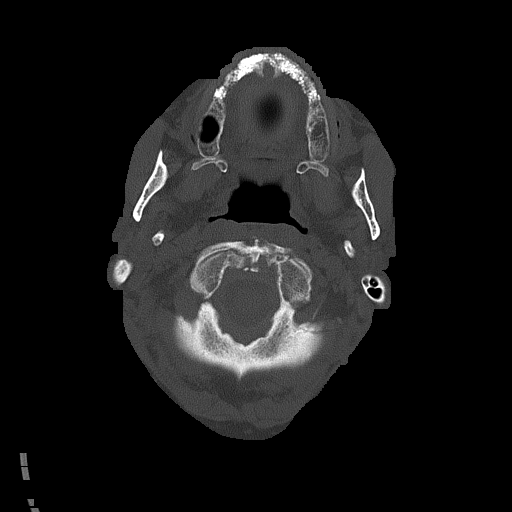
[im 17/85  bone]
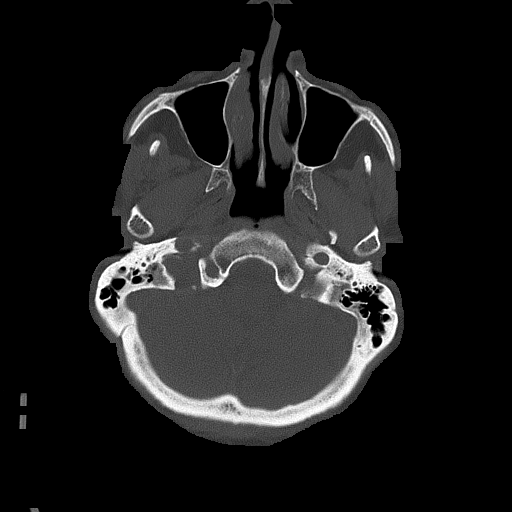
[im 26/85  bone]
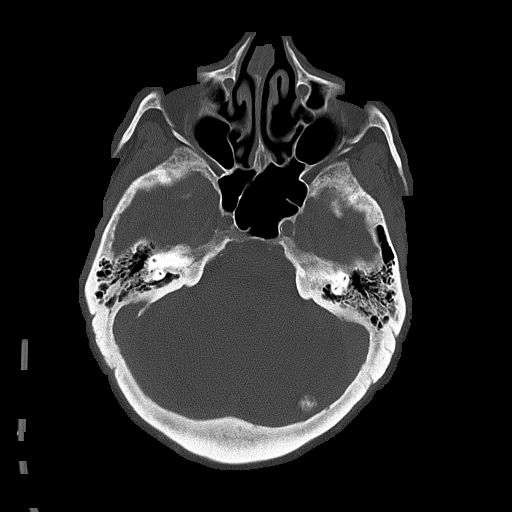

[Series 5: head without cor · coronal · non-contrast · 0.31mm/px · 3 of 72 slices shown]
[im 24/72  brain]
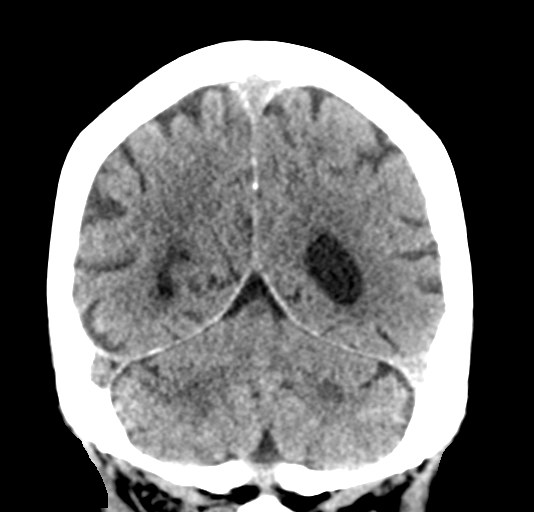
[im 32/72  brain]
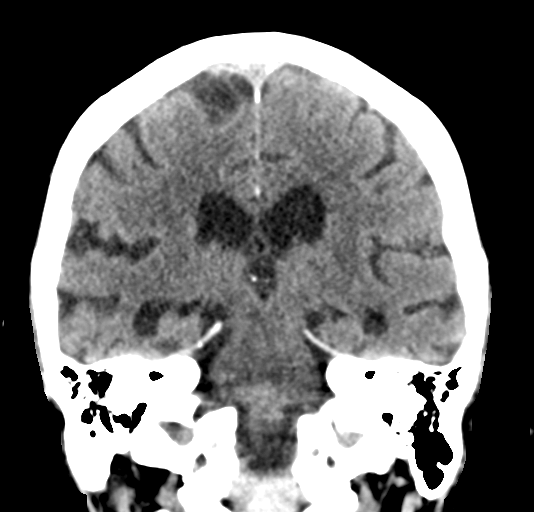
[im 40/72  brain]
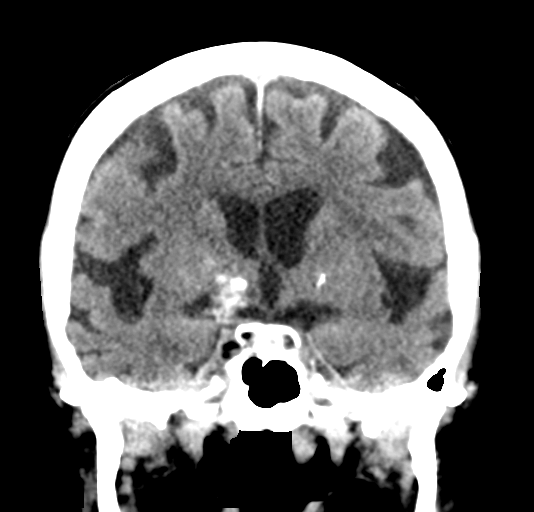

[Series 6: head without sag · sagittal · non-contrast · 0.34mm/px · 3 of 54 slices shown]
[im 18/54  brain]
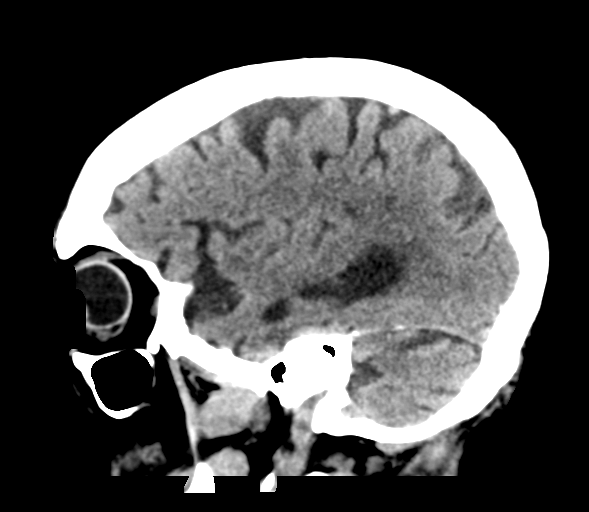
[im 27/54  brain]
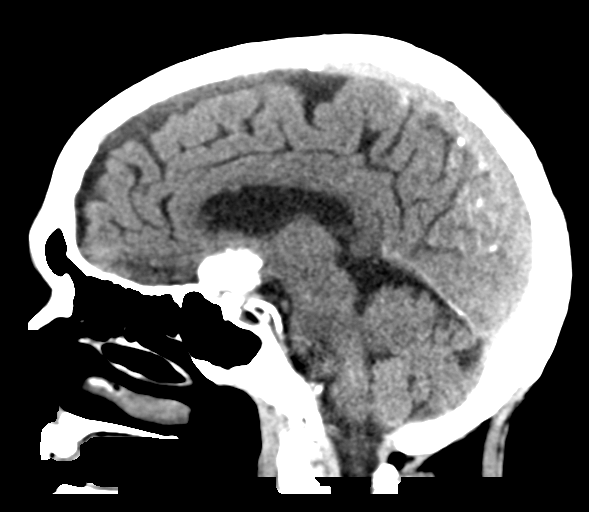
[im 36/54  brain]
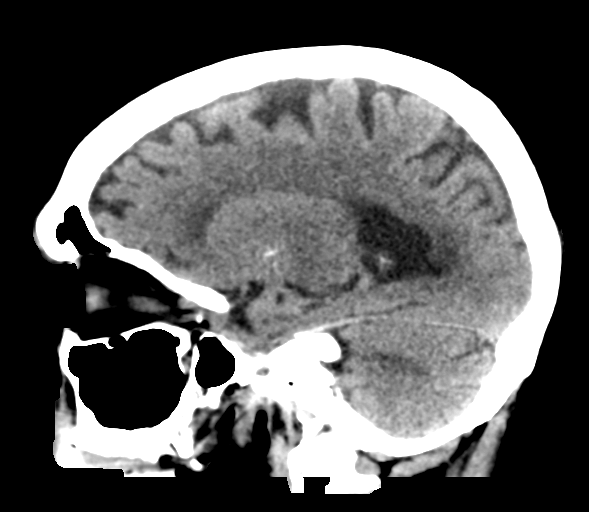

[16 of 47 positions shown; findings below may reference images not displayed]

FINDINGS: Brain: Similar size of the densely calcified right supraclinoid
presumed meningioma, measuring approximately 2.1 x 1.8 cm (similar
when remeasured on the prior). Similar size of the additional
smaller calcified presumed meningioma along the left tentorium.
Interval evolution of encephalomalacia associated with the left
inferior frontal gyrus/operculum infarcts seen on prior MRI. Small
bilateral cerebellar infarcts, better visualized Tat prior MRI.
Additional patchy white matter hypodensities, nonspecific but likely
related to chronic microvascular ischemic disease. No acute
hemorrhage. No hydrocephalus. Similar generalized cerebral atrophy
with ex vacuo ventricular dilation. No new mass effect.

Vascular: Calcific atherosclerosis. No hyperdense vessel identified.

Skull: No acute fracture.

Sinuses/Orbits: Clear sinuses.  Unremarkable orbits.

Other: No sizable mastoid effusions.
IMPRESSION: 1. No evidence of acute intracranial abnormality.
2. Interval evolution of encephalomalacia associated with the left
inferior frontal gyrus/operculum infarcts seen on prior MRI.
3. Similar size/appearance of the putative right supraclinoid and
left tentorial meningiomas, detailed above.
4. Moderate chronic microvascular ischemic disease.

## 2021-06-26 IMAGING — DX DG CHEST 1V PORT
1 series · 1 of 1 positions shown · non-contrast
Comparison: None.

CLINICAL DATA: Altered mental status

EXAM:
PORTABLE CHEST 1 VIEW

[chest]
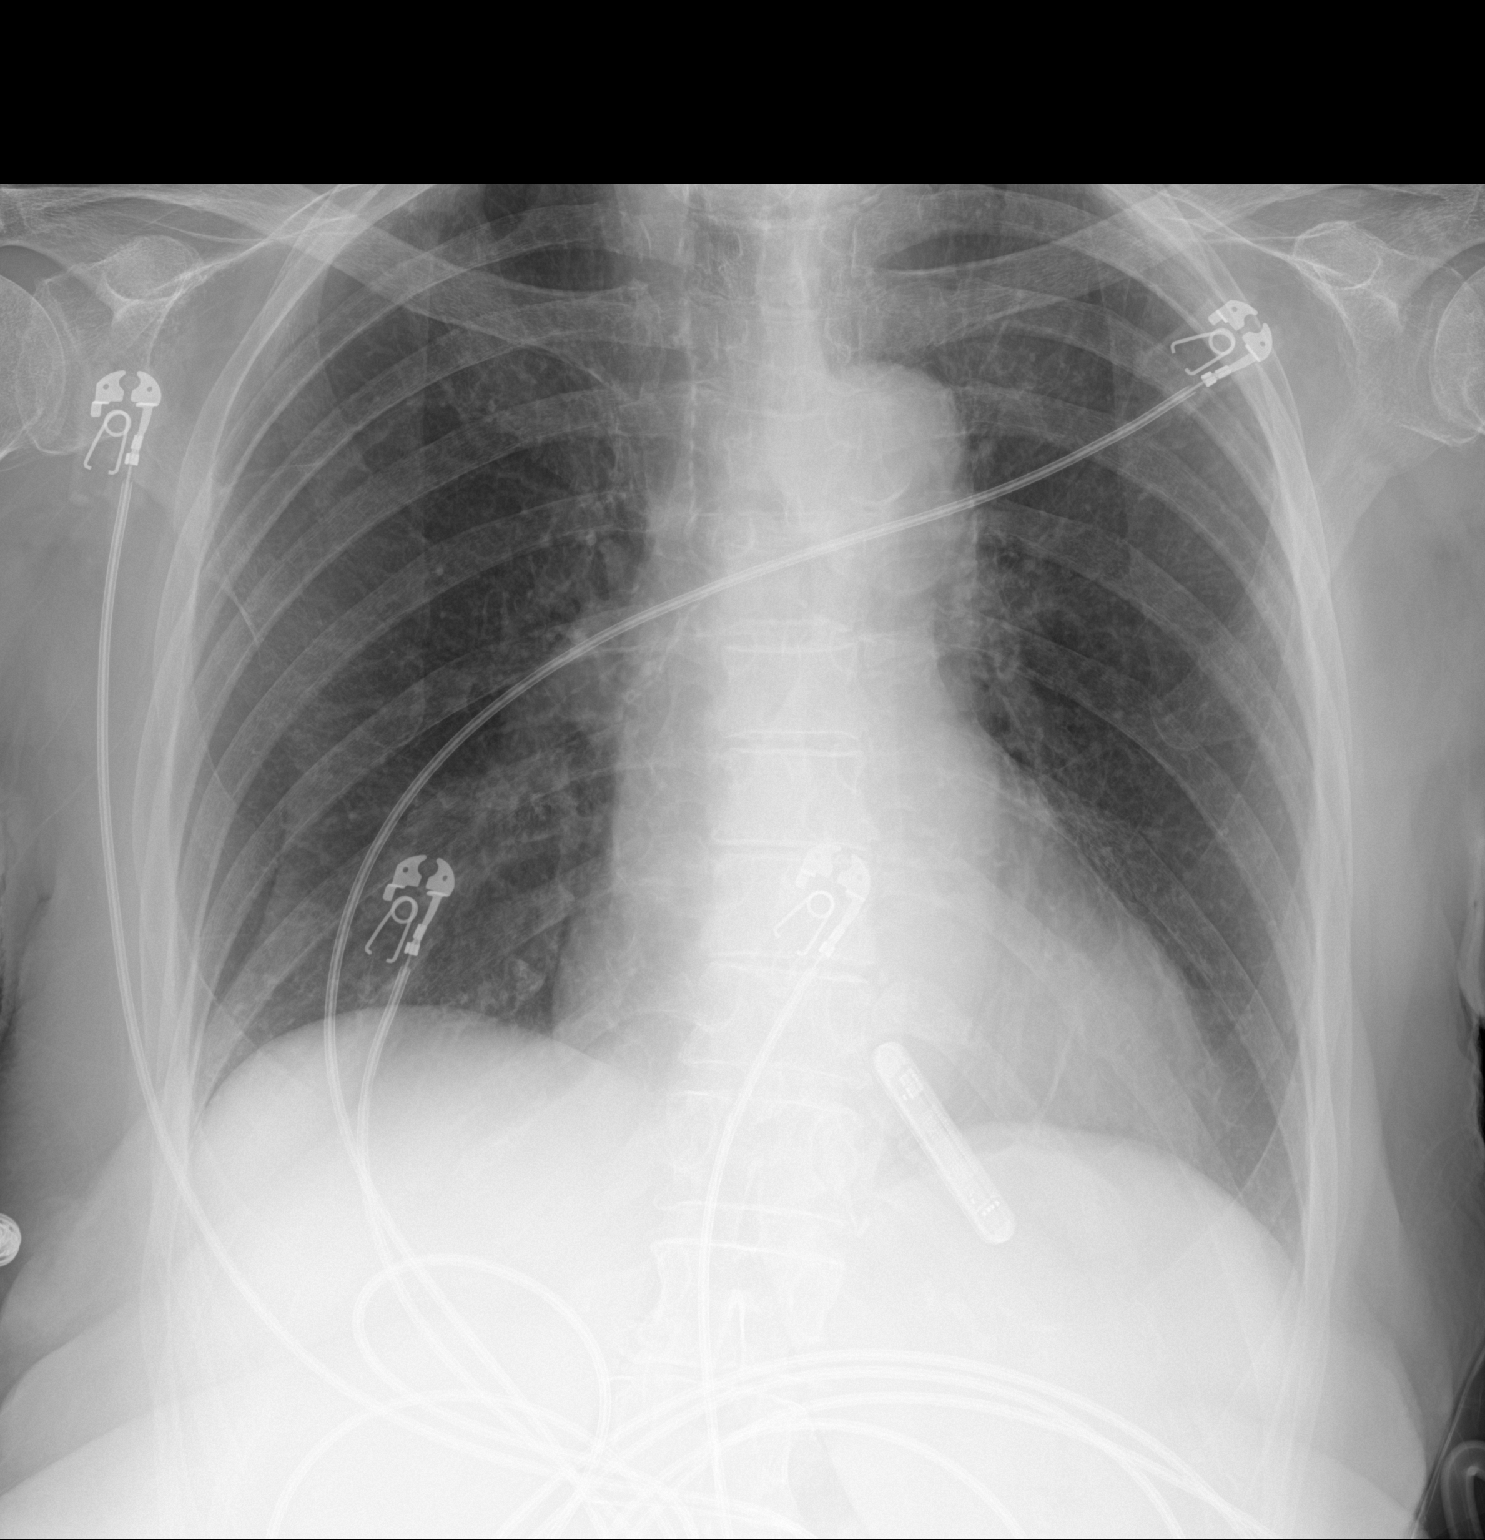

[1 of 1 positions shown; findings below may reference images not displayed]

FINDINGS: Normal heart size and mediastinal contours. No acute infiltrate or
edema. No effusion or pneumothorax. No acute osseous findings.

Implantable loop recorder.  Artifact from EKG leads
IMPRESSION: No evidence of acute disease.

## 2021-07-02 NOTE — Progress Notes (Signed)
Carelink Summary Report / Loop Recorder 

## 2021-07-16 DIAGNOSIS — K219 Gastro-esophageal reflux disease without esophagitis: Secondary | ICD-10-CM | POA: Diagnosis not present

## 2021-07-16 DIAGNOSIS — R197 Diarrhea, unspecified: Secondary | ICD-10-CM | POA: Diagnosis not present

## 2021-07-16 DIAGNOSIS — I10 Essential (primary) hypertension: Secondary | ICD-10-CM | POA: Diagnosis not present

## 2021-07-16 DIAGNOSIS — Z7901 Long term (current) use of anticoagulants: Secondary | ICD-10-CM | POA: Diagnosis not present

## 2021-07-16 DIAGNOSIS — Z8673 Personal history of transient ischemic attack (TIA), and cerebral infarction without residual deficits: Secondary | ICD-10-CM | POA: Diagnosis not present

## 2021-07-16 DIAGNOSIS — G301 Alzheimer's disease with late onset: Secondary | ICD-10-CM | POA: Diagnosis not present

## 2021-07-16 DIAGNOSIS — E559 Vitamin D deficiency, unspecified: Secondary | ICD-10-CM | POA: Diagnosis not present

## 2021-07-16 DIAGNOSIS — E782 Mixed hyperlipidemia: Secondary | ICD-10-CM | POA: Diagnosis not present

## 2021-07-27 ENCOUNTER — Ambulatory Visit (INDEPENDENT_AMBULATORY_CARE_PROVIDER_SITE_OTHER): Payer: Medicare Other

## 2021-07-27 DIAGNOSIS — I639 Cerebral infarction, unspecified: Secondary | ICD-10-CM

## 2021-07-27 LAB — CUP PACEART REMOTE DEVICE CHECK
Date Time Interrogation Session: 20230414230943
Implantable Pulse Generator Implant Date: 20210706

## 2021-08-10 DIAGNOSIS — I7091 Generalized atherosclerosis: Secondary | ICD-10-CM | POA: Diagnosis not present

## 2021-08-12 NOTE — Progress Notes (Signed)
Carelink Summary Report / Loop Recorder 

## 2021-08-31 ENCOUNTER — Ambulatory Visit (INDEPENDENT_AMBULATORY_CARE_PROVIDER_SITE_OTHER): Payer: Medicare Other

## 2021-08-31 DIAGNOSIS — I639 Cerebral infarction, unspecified: Secondary | ICD-10-CM

## 2021-09-02 LAB — CUP PACEART REMOTE DEVICE CHECK
Date Time Interrogation Session: 20230524085200
Implantable Pulse Generator Implant Date: 20210706

## 2021-09-03 DIAGNOSIS — G301 Alzheimer's disease with late onset: Secondary | ICD-10-CM | POA: Diagnosis not present

## 2021-09-08 DIAGNOSIS — D518 Other vitamin B12 deficiency anemias: Secondary | ICD-10-CM | POA: Diagnosis not present

## 2021-09-08 DIAGNOSIS — E119 Type 2 diabetes mellitus without complications: Secondary | ICD-10-CM | POA: Diagnosis not present

## 2021-09-08 DIAGNOSIS — E559 Vitamin D deficiency, unspecified: Secondary | ICD-10-CM | POA: Diagnosis not present

## 2021-09-08 DIAGNOSIS — E7849 Other hyperlipidemia: Secondary | ICD-10-CM | POA: Diagnosis not present

## 2021-09-08 DIAGNOSIS — Z79899 Other long term (current) drug therapy: Secondary | ICD-10-CM | POA: Diagnosis not present

## 2021-09-10 DIAGNOSIS — E678 Other specified hyperalimentation: Secondary | ICD-10-CM | POA: Diagnosis not present

## 2021-09-10 DIAGNOSIS — Z950 Presence of cardiac pacemaker: Secondary | ICD-10-CM | POA: Diagnosis not present

## 2021-09-10 DIAGNOSIS — E538 Deficiency of other specified B group vitamins: Secondary | ICD-10-CM | POA: Diagnosis not present

## 2021-09-10 DIAGNOSIS — R7989 Other specified abnormal findings of blood chemistry: Secondary | ICD-10-CM | POA: Diagnosis not present

## 2021-09-16 NOTE — Progress Notes (Signed)
Carelink Summary Report / Loop Recorder 

## 2021-10-05 ENCOUNTER — Ambulatory Visit (INDEPENDENT_AMBULATORY_CARE_PROVIDER_SITE_OTHER): Payer: Medicare Other

## 2021-10-05 DIAGNOSIS — I639 Cerebral infarction, unspecified: Secondary | ICD-10-CM | POA: Diagnosis not present

## 2021-10-06 LAB — CUP PACEART REMOTE DEVICE CHECK
Date Time Interrogation Session: 20230627083434
Implantable Pulse Generator Implant Date: 20210706

## 2021-10-19 DIAGNOSIS — I7091 Generalized atherosclerosis: Secondary | ICD-10-CM | POA: Diagnosis not present

## 2021-10-19 DIAGNOSIS — B351 Tinea unguium: Secondary | ICD-10-CM | POA: Diagnosis not present

## 2021-10-22 DIAGNOSIS — Z7401 Bed confinement status: Secondary | ICD-10-CM | POA: Diagnosis not present

## 2021-10-22 DIAGNOSIS — S0990XA Unspecified injury of head, initial encounter: Secondary | ICD-10-CM | POA: Diagnosis not present

## 2021-10-22 DIAGNOSIS — G4489 Other headache syndrome: Secondary | ICD-10-CM | POA: Diagnosis not present

## 2021-10-22 DIAGNOSIS — W19XXXA Unspecified fall, initial encounter: Secondary | ICD-10-CM | POA: Diagnosis not present

## 2021-10-22 DIAGNOSIS — Z743 Need for continuous supervision: Secondary | ICD-10-CM | POA: Diagnosis not present

## 2021-10-22 DIAGNOSIS — M2550 Pain in unspecified joint: Secondary | ICD-10-CM | POA: Diagnosis not present

## 2021-10-28 NOTE — Progress Notes (Signed)
Carelink Summary Report / Loop Recorder 

## 2021-10-30 DIAGNOSIS — N390 Urinary tract infection, site not specified: Secondary | ICD-10-CM | POA: Diagnosis not present

## 2021-11-09 ENCOUNTER — Ambulatory Visit (INDEPENDENT_AMBULATORY_CARE_PROVIDER_SITE_OTHER): Payer: Medicare Other

## 2021-11-09 DIAGNOSIS — I639 Cerebral infarction, unspecified: Secondary | ICD-10-CM

## 2021-11-09 LAB — CUP PACEART REMOTE DEVICE CHECK
Date Time Interrogation Session: 20230731084333
Implantable Pulse Generator Implant Date: 20210706

## 2021-11-19 DIAGNOSIS — R2681 Unsteadiness on feet: Secondary | ICD-10-CM | POA: Diagnosis not present

## 2021-11-19 DIAGNOSIS — M545 Low back pain, unspecified: Secondary | ICD-10-CM | POA: Diagnosis not present

## 2021-11-19 DIAGNOSIS — I872 Venous insufficiency (chronic) (peripheral): Secondary | ICD-10-CM | POA: Diagnosis not present

## 2021-11-19 DIAGNOSIS — H409 Unspecified glaucoma: Secondary | ICD-10-CM | POA: Diagnosis not present

## 2021-11-19 DIAGNOSIS — D329 Benign neoplasm of meninges, unspecified: Secondary | ICD-10-CM | POA: Diagnosis not present

## 2021-11-19 DIAGNOSIS — K219 Gastro-esophageal reflux disease without esophagitis: Secondary | ICD-10-CM | POA: Diagnosis not present

## 2021-11-19 DIAGNOSIS — I10 Essential (primary) hypertension: Secondary | ICD-10-CM | POA: Diagnosis not present

## 2021-11-19 DIAGNOSIS — M810 Age-related osteoporosis without current pathological fracture: Secondary | ICD-10-CM | POA: Diagnosis not present

## 2021-11-19 DIAGNOSIS — F01C4 Vascular dementia, severe, with anxiety: Secondary | ICD-10-CM | POA: Diagnosis not present

## 2021-11-19 DIAGNOSIS — Z950 Presence of cardiac pacemaker: Secondary | ICD-10-CM | POA: Diagnosis not present

## 2021-11-19 DIAGNOSIS — K529 Noninfective gastroenteritis and colitis, unspecified: Secondary | ICD-10-CM | POA: Diagnosis not present

## 2021-11-19 DIAGNOSIS — E876 Hypokalemia: Secondary | ICD-10-CM | POA: Diagnosis not present

## 2021-11-21 DIAGNOSIS — N390 Urinary tract infection, site not specified: Secondary | ICD-10-CM | POA: Diagnosis not present

## 2021-11-24 DIAGNOSIS — E559 Vitamin D deficiency, unspecified: Secondary | ICD-10-CM | POA: Diagnosis not present

## 2021-11-24 DIAGNOSIS — E119 Type 2 diabetes mellitus without complications: Secondary | ICD-10-CM | POA: Diagnosis not present

## 2021-11-24 DIAGNOSIS — E782 Mixed hyperlipidemia: Secondary | ICD-10-CM | POA: Diagnosis not present

## 2021-11-24 DIAGNOSIS — D518 Other vitamin B12 deficiency anemias: Secondary | ICD-10-CM | POA: Diagnosis not present

## 2021-11-24 DIAGNOSIS — Z79899 Other long term (current) drug therapy: Secondary | ICD-10-CM | POA: Diagnosis not present

## 2021-12-10 NOTE — Progress Notes (Signed)
Carelink Summary Report / Loop Recorder 

## 2021-12-14 LAB — CUP PACEART REMOTE DEVICE CHECK
Date Time Interrogation Session: 20230830231535
Implantable Pulse Generator Implant Date: 20210706

## 2021-12-15 ENCOUNTER — Ambulatory Visit (INDEPENDENT_AMBULATORY_CARE_PROVIDER_SITE_OTHER): Payer: Medicare Other

## 2021-12-15 DIAGNOSIS — I639 Cerebral infarction, unspecified: Secondary | ICD-10-CM | POA: Diagnosis not present

## 2021-12-24 DIAGNOSIS — I7091 Generalized atherosclerosis: Secondary | ICD-10-CM | POA: Diagnosis not present

## 2021-12-24 DIAGNOSIS — B351 Tinea unguium: Secondary | ICD-10-CM | POA: Diagnosis not present

## 2021-12-30 DIAGNOSIS — Z743 Need for continuous supervision: Secondary | ICD-10-CM | POA: Diagnosis not present

## 2021-12-30 DIAGNOSIS — M47812 Spondylosis without myelopathy or radiculopathy, cervical region: Secondary | ICD-10-CM | POA: Diagnosis not present

## 2021-12-30 DIAGNOSIS — G309 Alzheimer's disease, unspecified: Secondary | ICD-10-CM | POA: Diagnosis not present

## 2021-12-30 DIAGNOSIS — M5030 Other cervical disc degeneration, unspecified cervical region: Secondary | ICD-10-CM | POA: Diagnosis not present

## 2021-12-30 DIAGNOSIS — Z043 Encounter for examination and observation following other accident: Secondary | ICD-10-CM | POA: Diagnosis not present

## 2021-12-30 DIAGNOSIS — Z8673 Personal history of transient ischemic attack (TIA), and cerebral infarction without residual deficits: Secondary | ICD-10-CM | POA: Diagnosis not present

## 2021-12-30 DIAGNOSIS — I639 Cerebral infarction, unspecified: Secondary | ICD-10-CM | POA: Diagnosis not present

## 2021-12-30 DIAGNOSIS — R5381 Other malaise: Secondary | ICD-10-CM | POA: Diagnosis not present

## 2021-12-30 DIAGNOSIS — I10 Essential (primary) hypertension: Secondary | ICD-10-CM | POA: Diagnosis not present

## 2021-12-30 DIAGNOSIS — I69098 Other sequelae following nontraumatic subarachnoid hemorrhage: Secondary | ICD-10-CM | POA: Diagnosis not present

## 2021-12-30 DIAGNOSIS — G319 Degenerative disease of nervous system, unspecified: Secondary | ICD-10-CM | POA: Diagnosis not present

## 2021-12-30 DIAGNOSIS — W19XXXA Unspecified fall, initial encounter: Secondary | ICD-10-CM | POA: Diagnosis not present

## 2022-01-04 NOTE — Progress Notes (Signed)
Carelink Summary Report / Loop Recorder 

## 2022-01-06 DIAGNOSIS — R296 Repeated falls: Secondary | ICD-10-CM | POA: Diagnosis not present

## 2022-01-06 DIAGNOSIS — W19XXXA Unspecified fall, initial encounter: Secondary | ICD-10-CM | POA: Diagnosis not present

## 2022-01-06 DIAGNOSIS — G319 Degenerative disease of nervous system, unspecified: Secondary | ICD-10-CM | POA: Diagnosis not present

## 2022-01-06 DIAGNOSIS — M2578 Osteophyte, vertebrae: Secondary | ICD-10-CM | POA: Diagnosis not present

## 2022-01-06 DIAGNOSIS — R9431 Abnormal electrocardiogram [ECG] [EKG]: Secondary | ICD-10-CM | POA: Diagnosis not present

## 2022-01-06 DIAGNOSIS — S0093XA Contusion of unspecified part of head, initial encounter: Secondary | ICD-10-CM | POA: Diagnosis not present

## 2022-01-06 DIAGNOSIS — R0902 Hypoxemia: Secondary | ICD-10-CM | POA: Diagnosis not present

## 2022-01-06 DIAGNOSIS — R410 Disorientation, unspecified: Secondary | ICD-10-CM | POA: Diagnosis not present

## 2022-01-06 DIAGNOSIS — R9082 White matter disease, unspecified: Secondary | ICD-10-CM | POA: Diagnosis not present

## 2022-01-06 DIAGNOSIS — Z7401 Bed confinement status: Secondary | ICD-10-CM | POA: Diagnosis not present

## 2022-01-06 DIAGNOSIS — M5030 Other cervical disc degeneration, unspecified cervical region: Secondary | ICD-10-CM | POA: Diagnosis not present

## 2022-01-06 DIAGNOSIS — Z743 Need for continuous supervision: Secondary | ICD-10-CM | POA: Diagnosis not present

## 2022-01-06 DIAGNOSIS — G9389 Other specified disorders of brain: Secondary | ICD-10-CM | POA: Diagnosis not present

## 2022-01-11 ENCOUNTER — Telehealth: Payer: Self-pay

## 2022-01-11 NOTE — Telephone Encounter (Signed)
     Patient  visit on  9/20 at [facility]  Have you been able to follow up with your primary care physician? yes  The patient was or was not able to obtain any needed medicine or equipment. yes  Are there diet recommendations that you are having difficulty following? na  Patient expresses understanding of discharge instructions and education provided has no other needs at this time. yes     [facility], Care Management  [phone] [address], [place], [place] [address] Phone: [phone] Email: [email or web address]

## 2022-01-13 LAB — CUP PACEART REMOTE DEVICE CHECK
Date Time Interrogation Session: 20231002231550
Implantable Pulse Generator Implant Date: 20210706

## 2022-01-18 ENCOUNTER — Ambulatory Visit (INDEPENDENT_AMBULATORY_CARE_PROVIDER_SITE_OTHER): Payer: Medicare Other

## 2022-01-18 DIAGNOSIS — I639 Cerebral infarction, unspecified: Secondary | ICD-10-CM | POA: Diagnosis not present

## 2022-01-27 NOTE — Progress Notes (Signed)
Carelink Summary Report / Loop Recorder 

## 2022-02-20 LAB — CUP PACEART REMOTE DEVICE CHECK
Date Time Interrogation Session: 20231104230631
Implantable Pulse Generator Implant Date: 20210706

## 2022-02-22 ENCOUNTER — Ambulatory Visit (INDEPENDENT_AMBULATORY_CARE_PROVIDER_SITE_OTHER): Payer: Medicare Other

## 2022-02-22 DIAGNOSIS — I639 Cerebral infarction, unspecified: Secondary | ICD-10-CM

## 2022-02-25 DIAGNOSIS — G309 Alzheimer's disease, unspecified: Secondary | ICD-10-CM | POA: Diagnosis not present

## 2022-02-25 DIAGNOSIS — R1310 Dysphagia, unspecified: Secondary | ICD-10-CM | POA: Diagnosis not present

## 2022-02-25 DIAGNOSIS — Z8673 Personal history of transient ischemic attack (TIA), and cerebral infarction without residual deficits: Secondary | ICD-10-CM | POA: Diagnosis not present

## 2022-03-09 DIAGNOSIS — B351 Tinea unguium: Secondary | ICD-10-CM | POA: Diagnosis not present

## 2022-03-09 DIAGNOSIS — I7091 Generalized atherosclerosis: Secondary | ICD-10-CM | POA: Diagnosis not present

## 2022-03-25 NOTE — Progress Notes (Signed)
Carelink Summary Report / Loop Recorder 

## 2022-03-29 ENCOUNTER — Ambulatory Visit (INDEPENDENT_AMBULATORY_CARE_PROVIDER_SITE_OTHER): Payer: Medicare Other

## 2022-03-29 DIAGNOSIS — I639 Cerebral infarction, unspecified: Secondary | ICD-10-CM | POA: Diagnosis not present

## 2022-03-30 LAB — CUP PACEART REMOTE DEVICE CHECK
Date Time Interrogation Session: 20231217231940
Implantable Pulse Generator Implant Date: 20210706

## 2022-05-03 ENCOUNTER — Ambulatory Visit: Payer: Medicare Other | Attending: Cardiology

## 2022-05-03 DIAGNOSIS — I639 Cerebral infarction, unspecified: Secondary | ICD-10-CM | POA: Diagnosis not present

## 2022-05-03 NOTE — Progress Notes (Signed)
[facility] Summary Report / Loop Recorder

## 2022-05-04 LAB — CUP PACEART REMOTE DEVICE CHECK
Date Time Interrogation Session: 20240119230545
Implantable Pulse Generator Implant Date: 20210706

## 2022-05-13 DIAGNOSIS — E785 Hyperlipidemia, unspecified: Secondary | ICD-10-CM | POA: Diagnosis not present

## 2022-05-13 DIAGNOSIS — I119 Hypertensive heart disease without heart failure: Secondary | ICD-10-CM | POA: Diagnosis not present

## 2022-05-13 DIAGNOSIS — G894 Chronic pain syndrome: Secondary | ICD-10-CM | POA: Diagnosis not present

## 2022-05-13 DIAGNOSIS — K219 Gastro-esophageal reflux disease without esophagitis: Secondary | ICD-10-CM | POA: Diagnosis not present

## 2022-05-13 DIAGNOSIS — G309 Alzheimer's disease, unspecified: Secondary | ICD-10-CM | POA: Diagnosis not present

## 2022-05-13 DIAGNOSIS — Z8673 Personal history of transient ischemic attack (TIA), and cerebral infarction without residual deficits: Secondary | ICD-10-CM | POA: Diagnosis not present

## 2022-05-13 DIAGNOSIS — M810 Age-related osteoporosis without current pathological fracture: Secondary | ICD-10-CM | POA: Diagnosis not present

## 2022-05-13 DIAGNOSIS — M545 Low back pain, unspecified: Secondary | ICD-10-CM | POA: Diagnosis not present

## 2022-05-13 DIAGNOSIS — I872 Venous insufficiency (chronic) (peripheral): Secondary | ICD-10-CM | POA: Diagnosis not present

## 2022-05-18 DIAGNOSIS — E119 Type 2 diabetes mellitus without complications: Secondary | ICD-10-CM | POA: Diagnosis not present

## 2022-05-18 DIAGNOSIS — E038 Other specified hypothyroidism: Secondary | ICD-10-CM | POA: Diagnosis not present

## 2022-05-18 DIAGNOSIS — D518 Other vitamin B12 deficiency anemias: Secondary | ICD-10-CM | POA: Diagnosis not present

## 2022-05-18 DIAGNOSIS — E782 Mixed hyperlipidemia: Secondary | ICD-10-CM | POA: Diagnosis not present

## 2022-05-18 DIAGNOSIS — Z79899 Other long term (current) drug therapy: Secondary | ICD-10-CM | POA: Diagnosis not present

## 2022-05-20 ENCOUNTER — Encounter (HOSPITAL_COMMUNITY): Payer: Self-pay | Admitting: *Deleted

## 2022-05-20 DIAGNOSIS — D649 Anemia, unspecified: Secondary | ICD-10-CM | POA: Diagnosis not present

## 2022-05-20 DIAGNOSIS — M79642 Pain in left hand: Secondary | ICD-10-CM | POA: Diagnosis not present

## 2022-05-20 DIAGNOSIS — R799 Abnormal finding of blood chemistry, unspecified: Secondary | ICD-10-CM | POA: Diagnosis not present

## 2022-05-20 DIAGNOSIS — N1832 Chronic kidney disease, stage 3b: Secondary | ICD-10-CM | POA: Diagnosis not present

## 2022-05-20 DIAGNOSIS — G309 Alzheimer's disease, unspecified: Secondary | ICD-10-CM | POA: Diagnosis not present

## 2022-05-21 DIAGNOSIS — M79642 Pain in left hand: Secondary | ICD-10-CM | POA: Diagnosis not present

## 2022-06-01 DIAGNOSIS — B351 Tinea unguium: Secondary | ICD-10-CM | POA: Diagnosis not present

## 2022-06-01 DIAGNOSIS — I7091 Generalized atherosclerosis: Secondary | ICD-10-CM | POA: Diagnosis not present

## 2022-06-04 LAB — CUP PACEART REMOTE DEVICE CHECK
Date Time Interrogation Session: 20240221231900
Implantable Pulse Generator Implant Date: 20210706

## 2022-06-07 ENCOUNTER — Ambulatory Visit: Payer: Medicare Other

## 2022-06-07 DIAGNOSIS — I639 Cerebral infarction, unspecified: Secondary | ICD-10-CM

## 2022-06-14 DIAGNOSIS — Z8673 Personal history of transient ischemic attack (TIA), and cerebral infarction without residual deficits: Secondary | ICD-10-CM | POA: Diagnosis not present

## 2022-06-14 DIAGNOSIS — F0390 Unspecified dementia without behavioral disturbance: Secondary | ICD-10-CM | POA: Diagnosis not present

## 2022-06-14 DIAGNOSIS — Z9181 History of falling: Secondary | ICD-10-CM | POA: Diagnosis not present

## 2022-06-14 DIAGNOSIS — R1310 Dysphagia, unspecified: Secondary | ICD-10-CM | POA: Diagnosis not present

## 2022-06-14 DIAGNOSIS — Z7901 Long term (current) use of anticoagulants: Secondary | ICD-10-CM | POA: Diagnosis not present

## 2022-06-16 NOTE — Progress Notes (Signed)
Carelink Summary Report / Loop Recorder 

## 2022-06-22 DIAGNOSIS — Z9181 History of falling: Secondary | ICD-10-CM | POA: Diagnosis not present

## 2022-06-22 DIAGNOSIS — R1310 Dysphagia, unspecified: Secondary | ICD-10-CM | POA: Diagnosis not present

## 2022-06-22 DIAGNOSIS — Z7901 Long term (current) use of anticoagulants: Secondary | ICD-10-CM | POA: Diagnosis not present

## 2022-06-22 DIAGNOSIS — Z8673 Personal history of transient ischemic attack (TIA), and cerebral infarction without residual deficits: Secondary | ICD-10-CM | POA: Diagnosis not present

## 2022-06-29 DIAGNOSIS — Z7901 Long term (current) use of anticoagulants: Secondary | ICD-10-CM | POA: Diagnosis not present

## 2022-06-29 DIAGNOSIS — R1310 Dysphagia, unspecified: Secondary | ICD-10-CM | POA: Diagnosis not present

## 2022-06-29 DIAGNOSIS — Z9181 History of falling: Secondary | ICD-10-CM | POA: Diagnosis not present

## 2022-06-29 DIAGNOSIS — Z8673 Personal history of transient ischemic attack (TIA), and cerebral infarction without residual deficits: Secondary | ICD-10-CM | POA: Diagnosis not present

## 2022-07-01 DIAGNOSIS — Z8673 Personal history of transient ischemic attack (TIA), and cerebral infarction without residual deficits: Secondary | ICD-10-CM | POA: Diagnosis not present

## 2022-07-01 DIAGNOSIS — Z7901 Long term (current) use of anticoagulants: Secondary | ICD-10-CM | POA: Diagnosis not present

## 2022-07-01 DIAGNOSIS — Z9181 History of falling: Secondary | ICD-10-CM | POA: Diagnosis not present

## 2022-07-01 DIAGNOSIS — R1310 Dysphagia, unspecified: Secondary | ICD-10-CM | POA: Diagnosis not present

## 2022-07-12 ENCOUNTER — Ambulatory Visit (INDEPENDENT_AMBULATORY_CARE_PROVIDER_SITE_OTHER): Payer: Medicare Other

## 2022-07-12 DIAGNOSIS — I639 Cerebral infarction, unspecified: Secondary | ICD-10-CM | POA: Diagnosis not present

## 2022-07-12 LAB — CUP PACEART REMOTE DEVICE CHECK
Date Time Interrogation Session: 20240331231947
Implantable Pulse Generator Implant Date: 20210706

## 2022-07-15 NOTE — Progress Notes (Signed)
Carelink Summary Report / Loop Recorder 

## 2022-08-04 DIAGNOSIS — I7091 Generalized atherosclerosis: Secondary | ICD-10-CM | POA: Diagnosis not present

## 2022-08-04 DIAGNOSIS — B351 Tinea unguium: Secondary | ICD-10-CM | POA: Diagnosis not present

## 2022-08-15 LAB — CUP PACEART REMOTE DEVICE CHECK
Date Time Interrogation Session: 20240503231051
Implantable Pulse Generator Implant Date: 20210706

## 2022-08-16 ENCOUNTER — Ambulatory Visit (INDEPENDENT_AMBULATORY_CARE_PROVIDER_SITE_OTHER): Payer: Medicare Other

## 2022-08-16 DIAGNOSIS — I639 Cerebral infarction, unspecified: Secondary | ICD-10-CM

## 2022-08-17 NOTE — Progress Notes (Signed)
Carelink Summary Report / Loop Recorder 

## 2022-09-13 NOTE — Progress Notes (Signed)
Carelink Summary Report / Loop Recorder 

## 2022-09-20 ENCOUNTER — Ambulatory Visit (INDEPENDENT_AMBULATORY_CARE_PROVIDER_SITE_OTHER): Payer: Medicare Other

## 2022-09-20 DIAGNOSIS — I639 Cerebral infarction, unspecified: Secondary | ICD-10-CM

## 2022-09-20 LAB — CUP PACEART REMOTE DEVICE CHECK
Date Time Interrogation Session: 20240609231312
Implantable Pulse Generator Implant Date: 20210706

## 2022-09-30 DIAGNOSIS — M6281 Muscle weakness (generalized): Secondary | ICD-10-CM | POA: Diagnosis not present

## 2022-09-30 DIAGNOSIS — G309 Alzheimer's disease, unspecified: Secondary | ICD-10-CM | POA: Diagnosis not present

## 2022-09-30 DIAGNOSIS — Z8673 Personal history of transient ischemic attack (TIA), and cerebral infarction without residual deficits: Secondary | ICD-10-CM | POA: Diagnosis not present

## 2022-09-30 DIAGNOSIS — R2689 Other abnormalities of gait and mobility: Secondary | ICD-10-CM | POA: Diagnosis not present

## 2022-09-30 DIAGNOSIS — W19XXXA Unspecified fall, initial encounter: Secondary | ICD-10-CM | POA: Diagnosis not present

## 2022-09-30 DIAGNOSIS — R269 Unspecified abnormalities of gait and mobility: Secondary | ICD-10-CM | POA: Diagnosis not present

## 2022-09-30 DIAGNOSIS — Z9181 History of falling: Secondary | ICD-10-CM | POA: Diagnosis not present

## 2022-10-11 NOTE — Progress Notes (Signed)
Carelink Summary Report / Loop Recorder 

## 2022-10-12 DIAGNOSIS — I7091 Generalized atherosclerosis: Secondary | ICD-10-CM | POA: Diagnosis not present

## 2022-10-12 DIAGNOSIS — B351 Tinea unguium: Secondary | ICD-10-CM | POA: Diagnosis not present

## 2022-10-25 ENCOUNTER — Ambulatory Visit: Payer: Medicare Other

## 2022-10-25 DIAGNOSIS — I639 Cerebral infarction, unspecified: Secondary | ICD-10-CM

## 2022-10-25 LAB — CUP PACEART REMOTE DEVICE CHECK
Date Time Interrogation Session: 20240712230623
Implantable Pulse Generator Implant Date: 20210706

## 2022-10-28 DIAGNOSIS — W19XXXA Unspecified fall, initial encounter: Secondary | ICD-10-CM | POA: Diagnosis not present

## 2022-10-28 DIAGNOSIS — R269 Unspecified abnormalities of gait and mobility: Secondary | ICD-10-CM | POA: Diagnosis not present

## 2022-10-28 DIAGNOSIS — R2689 Other abnormalities of gait and mobility: Secondary | ICD-10-CM | POA: Diagnosis not present

## 2022-10-28 DIAGNOSIS — Z9181 History of falling: Secondary | ICD-10-CM | POA: Diagnosis not present

## 2022-10-28 DIAGNOSIS — M6281 Muscle weakness (generalized): Secondary | ICD-10-CM | POA: Diagnosis not present

## 2022-10-28 DIAGNOSIS — G309 Alzheimer's disease, unspecified: Secondary | ICD-10-CM | POA: Diagnosis not present

## 2022-11-05 NOTE — Progress Notes (Signed)
Carelink Summary Report / Loop Recorder 

## 2022-11-25 LAB — CUP PACEART REMOTE DEVICE CHECK
Date Time Interrogation Session: 20240814231127
Implantable Pulse Generator Implant Date: 20210706

## 2022-11-29 ENCOUNTER — Ambulatory Visit (INDEPENDENT_AMBULATORY_CARE_PROVIDER_SITE_OTHER): Payer: Medicare Other

## 2022-11-29 DIAGNOSIS — I639 Cerebral infarction, unspecified: Secondary | ICD-10-CM

## 2022-12-03 DIAGNOSIS — M85842 Other specified disorders of bone density and structure, left hand: Secondary | ICD-10-CM | POA: Diagnosis not present

## 2022-12-03 DIAGNOSIS — M1812 Unilateral primary osteoarthritis of first carpometacarpal joint, left hand: Secondary | ICD-10-CM | POA: Diagnosis not present

## 2022-12-08 NOTE — Progress Notes (Signed)
Carelink Summary Report / Loop Recorder 

## 2022-12-09 DIAGNOSIS — M19042 Primary osteoarthritis, left hand: Secondary | ICD-10-CM | POA: Diagnosis not present

## 2022-12-09 DIAGNOSIS — L03114 Cellulitis of left upper limb: Secondary | ICD-10-CM | POA: Diagnosis not present

## 2022-12-09 DIAGNOSIS — G309 Alzheimer's disease, unspecified: Secondary | ICD-10-CM | POA: Diagnosis not present

## 2022-12-09 DIAGNOSIS — S61412A Laceration without foreign body of left hand, initial encounter: Secondary | ICD-10-CM | POA: Diagnosis not present

## 2022-12-19 DIAGNOSIS — Z7901 Long term (current) use of anticoagulants: Secondary | ICD-10-CM | POA: Diagnosis not present

## 2022-12-19 DIAGNOSIS — S0990XA Unspecified injury of head, initial encounter: Secondary | ICD-10-CM | POA: Diagnosis not present

## 2022-12-19 DIAGNOSIS — Z79899 Other long term (current) drug therapy: Secondary | ICD-10-CM | POA: Diagnosis not present

## 2022-12-19 DIAGNOSIS — R6889 Other general symptoms and signs: Secondary | ICD-10-CM | POA: Diagnosis not present

## 2022-12-19 DIAGNOSIS — Z743 Need for continuous supervision: Secondary | ICD-10-CM | POA: Diagnosis not present

## 2022-12-19 DIAGNOSIS — S01112A Laceration without foreign body of left eyelid and periocular area, initial encounter: Secondary | ICD-10-CM | POA: Diagnosis not present

## 2022-12-19 DIAGNOSIS — S0181XA Laceration without foreign body of other part of head, initial encounter: Secondary | ICD-10-CM | POA: Diagnosis not present

## 2022-12-19 DIAGNOSIS — M47812 Spondylosis without myelopathy or radiculopathy, cervical region: Secondary | ICD-10-CM | POA: Diagnosis not present

## 2022-12-19 DIAGNOSIS — R404 Transient alteration of awareness: Secondary | ICD-10-CM | POA: Diagnosis not present

## 2022-12-19 DIAGNOSIS — W19XXXA Unspecified fall, initial encounter: Secondary | ICD-10-CM | POA: Diagnosis not present

## 2022-12-19 DIAGNOSIS — Z23 Encounter for immunization: Secondary | ICD-10-CM | POA: Diagnosis not present

## 2022-12-20 DIAGNOSIS — R404 Transient alteration of awareness: Secondary | ICD-10-CM | POA: Diagnosis not present

## 2022-12-20 DIAGNOSIS — Z743 Need for continuous supervision: Secondary | ICD-10-CM | POA: Diagnosis not present

## 2022-12-23 DIAGNOSIS — Z9181 History of falling: Secondary | ICD-10-CM

## 2022-12-23 DIAGNOSIS — W19XXXA Unspecified fall, initial encounter: Secondary | ICD-10-CM

## 2022-12-23 DIAGNOSIS — G309 Alzheimer's disease, unspecified: Secondary | ICD-10-CM

## 2022-12-23 DIAGNOSIS — M6281 Muscle weakness (generalized): Secondary | ICD-10-CM

## 2022-12-23 DIAGNOSIS — R2689 Other abnormalities of gait and mobility: Secondary | ICD-10-CM

## 2022-12-23 DIAGNOSIS — S0083XA Contusion of other part of head, initial encounter: Secondary | ICD-10-CM

## 2022-12-23 DIAGNOSIS — S01112A Laceration without foreign body of left eyelid and periocular area, initial encounter: Secondary | ICD-10-CM

## 2022-12-30 DIAGNOSIS — G309 Alzheimer's disease, unspecified: Secondary | ICD-10-CM

## 2022-12-30 DIAGNOSIS — W19XXXA Unspecified fall, initial encounter: Secondary | ICD-10-CM

## 2022-12-30 DIAGNOSIS — R2689 Other abnormalities of gait and mobility: Secondary | ICD-10-CM

## 2022-12-30 DIAGNOSIS — Z9181 History of falling: Secondary | ICD-10-CM

## 2022-12-30 DIAGNOSIS — M6281 Muscle weakness (generalized): Secondary | ICD-10-CM

## 2023-01-03 ENCOUNTER — Ambulatory Visit (INDEPENDENT_AMBULATORY_CARE_PROVIDER_SITE_OTHER): Payer: Medicare Other

## 2023-01-03 DIAGNOSIS — I639 Cerebral infarction, unspecified: Secondary | ICD-10-CM

## 2023-01-07 DIAGNOSIS — W19XXXA Unspecified fall, initial encounter: Secondary | ICD-10-CM | POA: Diagnosis not present

## 2023-01-07 DIAGNOSIS — M25512 Pain in left shoulder: Secondary | ICD-10-CM | POA: Diagnosis not present

## 2023-01-08 DIAGNOSIS — S40012A Contusion of left shoulder, initial encounter: Secondary | ICD-10-CM | POA: Diagnosis not present

## 2023-01-08 DIAGNOSIS — R609 Edema, unspecified: Secondary | ICD-10-CM | POA: Diagnosis not present

## 2023-01-08 DIAGNOSIS — S4992XA Unspecified injury of left shoulder and upper arm, initial encounter: Secondary | ICD-10-CM | POA: Diagnosis not present

## 2023-01-08 DIAGNOSIS — I10 Essential (primary) hypertension: Secondary | ICD-10-CM | POA: Diagnosis not present

## 2023-01-08 DIAGNOSIS — Z743 Need for continuous supervision: Secondary | ICD-10-CM | POA: Diagnosis not present

## 2023-01-08 DIAGNOSIS — M19012 Primary osteoarthritis, left shoulder: Secondary | ICD-10-CM | POA: Diagnosis not present

## 2023-01-08 DIAGNOSIS — W19XXXA Unspecified fall, initial encounter: Secondary | ICD-10-CM | POA: Diagnosis not present

## 2023-01-08 DIAGNOSIS — M79603 Pain in arm, unspecified: Secondary | ICD-10-CM | POA: Diagnosis not present

## 2023-01-08 DIAGNOSIS — M70812 Other soft tissue disorders related to use, overuse and pressure, left shoulder: Secondary | ICD-10-CM | POA: Diagnosis not present

## 2023-01-08 DIAGNOSIS — R531 Weakness: Secondary | ICD-10-CM | POA: Diagnosis not present

## 2023-01-08 DIAGNOSIS — M25512 Pain in left shoulder: Secondary | ICD-10-CM | POA: Diagnosis not present

## 2023-01-13 DIAGNOSIS — Z8673 Personal history of transient ischemic attack (TIA), and cerebral infarction without residual deficits: Secondary | ICD-10-CM | POA: Diagnosis not present

## 2023-01-13 DIAGNOSIS — Z9181 History of falling: Secondary | ICD-10-CM | POA: Diagnosis not present

## 2023-01-13 DIAGNOSIS — R296 Repeated falls: Secondary | ICD-10-CM | POA: Diagnosis not present

## 2023-01-13 DIAGNOSIS — I679 Cerebrovascular disease, unspecified: Secondary | ICD-10-CM | POA: Diagnosis not present

## 2023-01-13 DIAGNOSIS — W19XXXA Unspecified fall, initial encounter: Secondary | ICD-10-CM | POA: Diagnosis not present

## 2023-01-13 DIAGNOSIS — G309 Alzheimer's disease, unspecified: Secondary | ICD-10-CM | POA: Diagnosis not present

## 2023-01-13 DIAGNOSIS — M6281 Muscle weakness (generalized): Secondary | ICD-10-CM | POA: Diagnosis not present

## 2023-01-13 DIAGNOSIS — S43402A Unspecified sprain of left shoulder joint, initial encounter: Secondary | ICD-10-CM | POA: Diagnosis not present

## 2023-01-13 NOTE — Progress Notes (Signed)
Carelink Summary Report / Loop Recorder 

## 2023-01-20 DIAGNOSIS — G309 Alzheimer's disease, unspecified: Secondary | ICD-10-CM | POA: Diagnosis not present

## 2023-01-20 DIAGNOSIS — M25512 Pain in left shoulder: Secondary | ICD-10-CM | POA: Diagnosis not present

## 2023-01-20 DIAGNOSIS — Z8673 Personal history of transient ischemic attack (TIA), and cerebral infarction without residual deficits: Secondary | ICD-10-CM | POA: Diagnosis not present

## 2023-01-27 ENCOUNTER — Other Ambulatory Visit: Payer: Self-pay | Admitting: Internal Medicine

## 2023-01-27 DIAGNOSIS — Z9181 History of falling: Secondary | ICD-10-CM | POA: Diagnosis not present

## 2023-01-27 DIAGNOSIS — R2689 Other abnormalities of gait and mobility: Secondary | ICD-10-CM | POA: Diagnosis not present

## 2023-01-27 DIAGNOSIS — M6281 Muscle weakness (generalized): Secondary | ICD-10-CM | POA: Diagnosis not present

## 2023-01-27 DIAGNOSIS — W19XXXA Unspecified fall, initial encounter: Secondary | ICD-10-CM | POA: Diagnosis not present

## 2023-01-27 DIAGNOSIS — F0390 Unspecified dementia without behavioral disturbance: Secondary | ICD-10-CM | POA: Diagnosis not present

## 2023-02-04 DIAGNOSIS — R2689 Other abnormalities of gait and mobility: Secondary | ICD-10-CM | POA: Diagnosis not present

## 2023-02-04 DIAGNOSIS — Z9181 History of falling: Secondary | ICD-10-CM | POA: Diagnosis not present

## 2023-02-04 DIAGNOSIS — Z8673 Personal history of transient ischemic attack (TIA), and cerebral infarction without residual deficits: Secondary | ICD-10-CM | POA: Diagnosis not present

## 2023-02-04 DIAGNOSIS — W19XXXA Unspecified fall, initial encounter: Secondary | ICD-10-CM | POA: Diagnosis not present

## 2023-02-04 DIAGNOSIS — M6281 Muscle weakness (generalized): Secondary | ICD-10-CM | POA: Diagnosis not present

## 2023-02-04 DIAGNOSIS — G309 Alzheimer's disease, unspecified: Secondary | ICD-10-CM | POA: Diagnosis not present

## 2023-02-07 ENCOUNTER — Ambulatory Visit (INDEPENDENT_AMBULATORY_CARE_PROVIDER_SITE_OTHER)

## 2023-02-07 DIAGNOSIS — I639 Cerebral infarction, unspecified: Secondary | ICD-10-CM

## 2023-02-08 LAB — CUP PACEART REMOTE DEVICE CHECK
Date Time Interrogation Session: 20241027230138
Implantable Pulse Generator Implant Date: 20210706

## 2023-02-10 DIAGNOSIS — Z8673 Personal history of transient ischemic attack (TIA), and cerebral infarction without residual deficits: Secondary | ICD-10-CM | POA: Diagnosis not present

## 2023-02-10 DIAGNOSIS — N3946 Mixed incontinence: Secondary | ICD-10-CM | POA: Diagnosis not present

## 2023-02-10 DIAGNOSIS — F0390 Unspecified dementia without behavioral disturbance: Secondary | ICD-10-CM | POA: Diagnosis not present

## 2023-02-10 DIAGNOSIS — Z9181 History of falling: Secondary | ICD-10-CM | POA: Diagnosis not present

## 2023-02-10 DIAGNOSIS — R197 Diarrhea, unspecified: Secondary | ICD-10-CM | POA: Diagnosis not present

## 2023-02-17 DIAGNOSIS — F419 Anxiety disorder, unspecified: Secondary | ICD-10-CM | POA: Diagnosis not present

## 2023-02-17 DIAGNOSIS — R451 Restlessness and agitation: Secondary | ICD-10-CM | POA: Diagnosis not present

## 2023-02-17 DIAGNOSIS — G309 Alzheimer's disease, unspecified: Secondary | ICD-10-CM | POA: Diagnosis not present

## 2023-02-23 NOTE — Progress Notes (Signed)
Carelink Summary Report / Loop Recorder 

## 2023-03-03 DIAGNOSIS — Z8673 Personal history of transient ischemic attack (TIA), and cerebral infarction without residual deficits: Secondary | ICD-10-CM | POA: Diagnosis not present

## 2023-03-03 DIAGNOSIS — Z9181 History of falling: Secondary | ICD-10-CM | POA: Diagnosis not present

## 2023-03-03 DIAGNOSIS — M6281 Muscle weakness (generalized): Secondary | ICD-10-CM | POA: Diagnosis not present

## 2023-03-03 DIAGNOSIS — F0390 Unspecified dementia without behavioral disturbance: Secondary | ICD-10-CM | POA: Diagnosis not present

## 2023-03-13 LAB — CUP PACEART REMOTE DEVICE CHECK
Date Time Interrogation Session: 20241129230017
Implantable Pulse Generator Implant Date: 20210706

## 2023-03-14 ENCOUNTER — Ambulatory Visit (INDEPENDENT_AMBULATORY_CARE_PROVIDER_SITE_OTHER)

## 2023-03-14 DIAGNOSIS — I639 Cerebral infarction, unspecified: Secondary | ICD-10-CM

## 2023-03-17 DIAGNOSIS — G309 Alzheimer's disease, unspecified: Secondary | ICD-10-CM | POA: Diagnosis not present

## 2023-03-17 DIAGNOSIS — I739 Peripheral vascular disease, unspecified: Secondary | ICD-10-CM | POA: Diagnosis not present

## 2023-03-17 DIAGNOSIS — I119 Hypertensive heart disease without heart failure: Secondary | ICD-10-CM | POA: Diagnosis not present

## 2023-03-17 DIAGNOSIS — Z8673 Personal history of transient ischemic attack (TIA), and cerebral infarction without residual deficits: Secondary | ICD-10-CM

## 2023-03-17 DIAGNOSIS — Z9181 History of falling: Secondary | ICD-10-CM

## 2023-03-17 DIAGNOSIS — M6281 Muscle weakness (generalized): Secondary | ICD-10-CM

## 2023-03-17 DIAGNOSIS — E785 Hyperlipidemia, unspecified: Secondary | ICD-10-CM | POA: Diagnosis not present

## 2023-03-17 DIAGNOSIS — K219 Gastro-esophageal reflux disease without esophagitis: Secondary | ICD-10-CM

## 2023-04-13 DEATH — deceased

## 2023-04-18 ENCOUNTER — Ambulatory Visit: Payer: Medicare Other
